# Patient Record
Sex: Male | Born: 1965 | Race: White | Hispanic: No | Marital: Married | Smoking: Never smoker
Health system: Southern US, Community
[De-identification: ages and names within clinical notes are randomized; demographics above are authoritative.]

## PROBLEM LIST (undated history)

## (undated) DIAGNOSIS — I509 Heart failure, unspecified: Secondary | ICD-10-CM

## (undated) DIAGNOSIS — I10 Essential (primary) hypertension: Secondary | ICD-10-CM

## (undated) DIAGNOSIS — I219 Acute myocardial infarction, unspecified: Secondary | ICD-10-CM

## (undated) DIAGNOSIS — I2510 Atherosclerotic heart disease of native coronary artery without angina pectoris: Secondary | ICD-10-CM

## (undated) HISTORY — DX: Atherosclerotic heart disease of native coronary artery without angina pectoris: I25.10

## (undated) HISTORY — PX: OTHER SURGICAL HISTORY: SHX169

## (undated) HISTORY — PX: CORONARY ARTERY BYPASS GRAFT: SHX141

## (undated) HISTORY — DX: Heart failure, unspecified: I50.9

---

## 2004-10-23 ENCOUNTER — Emergency Department: Payer: Self-pay | Admitting: Emergency Medicine

## 2004-10-28 ENCOUNTER — Emergency Department: Payer: Self-pay | Admitting: Emergency Medicine

## 2012-11-08 ENCOUNTER — Encounter (HOSPITAL_COMMUNITY): Payer: Self-pay | Admitting: *Deleted

## 2012-11-08 ENCOUNTER — Emergency Department (HOSPITAL_COMMUNITY)
Admission: EM | Admit: 2012-11-08 | Discharge: 2012-11-08 | Disposition: A | Discharge status: Home / Self Care | Payer: Self-pay | Attending: Emergency Medicine | Admitting: Emergency Medicine

## 2012-11-08 DIAGNOSIS — I10 Essential (primary) hypertension: Secondary | ICD-10-CM | POA: Insufficient documentation

## 2012-11-08 DIAGNOSIS — J329 Chronic sinusitis, unspecified: Secondary | ICD-10-CM

## 2012-11-08 DIAGNOSIS — R0982 Postnasal drip: Secondary | ICD-10-CM | POA: Insufficient documentation

## 2012-11-08 DIAGNOSIS — H538 Other visual disturbances: Secondary | ICD-10-CM | POA: Insufficient documentation

## 2012-11-08 DIAGNOSIS — J019 Acute sinusitis, unspecified: Secondary | ICD-10-CM | POA: Insufficient documentation

## 2012-11-08 DIAGNOSIS — R059 Cough, unspecified: Secondary | ICD-10-CM | POA: Insufficient documentation

## 2012-11-08 DIAGNOSIS — R05 Cough: Secondary | ICD-10-CM | POA: Insufficient documentation

## 2012-11-08 HISTORY — DX: Essential (primary) hypertension: I10

## 2012-11-08 MED ORDER — PREDNISONE 20 MG PO TABS: ORAL_TABLET | ORAL | Status: DC

## 2012-11-08 MED ORDER — AZITHROMYCIN 250 MG PO TABS: 250.0000 mg | ORAL_TABLET | Freq: Every day | ORAL | Status: DC

## 2012-11-08 MED ORDER — FLUTICASONE PROPIONATE 50 MCG/ACT NA SUSP: 2.0000 | Freq: Every day | NASAL | Status: DC

## 2012-11-08 NOTE — ED Notes (Signed)
Pt in c/o congestion and facial pressure, history of allergies and sinus infection in the past, states this feels the same

## 2012-11-08 NOTE — ED Provider Notes (Signed)
History    This chart was scribed for Junious Silk (PA) non-physician practitioner working with Loren Racer, MD by Sofie Rower, ED Scribe. This patient was seen in room WTR9/WTR9 and the patient's care was started at 8:33PM.   CSN: [id number]  Arrival date & time 11/08/12  1949   First MD Initiated Contact with Patient 11/08/12 2033      Chief Complaint  Patient presents with  . Recurrent Sinusitis    (Consider location/radiation/quality/duration/timing/severity/associated sxs/prior treatment) The history is provided by the patient. No language interpreter was used.    Robert Berry is a 47 y.o. male , with a hx of hypertension and sinus infection (X 2 per year at baseline), who presents to the Emergency Department complaining of gradual, progressively worsening, recurrent sinusitis, onset three weeks ago.  Associated symptoms include facial pain, sore throat, non productive cough and  blurred vision. The pt reports he has been experiencing a progressively worsening facial congestion and pressure for the past three weeks, however, he has been unable to sleep for the past three days, which has prompted his concern and desire to seek medical evaluation at [facility] this evening (11/08/12). The pt has taken Zyrtec and Flonase in the past, which he informs, does not provide relief of the recurrent sinusitis.  The pt does not smoke or drink alcohol.   Pt does not have a PCP. He is moving back to [place], [place] and will establish care there.     Past Medical History  Diagnosis Date  . Hypertension     History reviewed. No pertinent past surgical history.  History reviewed. No pertinent family history.  History  Substance Use Topics  . Smoking status: Not on file  . Smokeless tobacco: Not on file  . Alcohol Use: Not on file      Review of Systems  HENT: Positive for sinus pressure. Negative for ear pain.   Eyes: Positive for visual disturbance.  All other systems reviewed and  are negative.    Allergies  Review of patient's allergies indicates no known allergies.  Home Medications  No current outpatient prescriptions on file.  BP 138/102  Pulse 102  Temp(Src) 98.3 F (36.8 C) (Oral)  Resp 20  Ht 5\' 8"  (1.727 m)  Wt 172 lb (78.019 kg)  BMI 26.16 kg/m2  SpO2 100%  Physical Exam  Nursing note and vitals reviewed. Constitutional: He is oriented to person, place, and time. He appears well-developed and well-nourished. No distress.  HENT:  Head: Normocephalic and atraumatic.  Right Ear: Tympanic membrane and external ear normal.  Left Ear: Tympanic membrane and external ear normal.  Nose: Right sinus exhibits maxillary sinus tenderness and frontal sinus tenderness. Left sinus exhibits maxillary sinus tenderness and frontal sinus tenderness.  Mouth/Throat: No tonsillar abscesses.  Frontal sinus tenderness, maxillary sinus tenderness and visible post nasal drip at oropharynx.  Eyes: Conjunctivae are normal.  Neck: Normal range of motion. No tracheal deviation present.  Cardiovascular: Normal rate, regular rhythm and normal heart sounds.   Pulmonary/Chest: Effort normal and breath sounds normal. No stridor.  Abdominal: Soft. He exhibits no distension. There is no tenderness.  Musculoskeletal: Normal range of motion.  Neurological: He is alert and oriented to person, place, and time.  Skin: Skin is warm and dry. He is not diaphoretic.  Psychiatric: He has a normal mood and affect. His behavior is normal.    ED Course  Procedures (including critical care time)  DIAGNOSTIC STUDIES: Oxygen Saturation is 100% on room air,  normal by my interpretation.    COORDINATION OF CARE:  8:58 PM- Treatment plan concerning the application of antibiotics, acquisition of a PCP in [place], [place]., and referral to ENT discussed with patient. Pt agrees with treatment.      Labs Reviewed - No data to display No results found.   1. Sinusitis       MDM   Patient complaining of symptoms of sinusitis.    Severe symptoms have been present for greater than 10 days with purulent nasal discharge and maxillary sinus pain.  Concern for acute bacterial rhinosinusitis.  Patient discharged with Azithromycin.  Instructions given for warm saline nasal wash and recommendations for follow-up with primary care physician and possible ENT due to recurrence of sinus infections.  Flonase given.         I personally performed the services described in this documentation, which was scribed in my presence. The recorded information has been reviewed and is accurate.     Mora Bellman, PA-C 11/09/12 1348

## 2012-11-08 NOTE — ED Notes (Signed)
Pt ambulatory to exam room with steady gait. Pt states he has had headache and pain and pressure in face. States he has had nasal congestion with yellow discharge. Pt with no acute distress.

## 2012-11-09 NOTE — ED Provider Notes (Signed)
Medical screening examination/treatment/procedure(s) were performed by non-physician practitioner and as supervising physician I was immediately available for consultation/collaboration.   Loren Racer, MD 11/09/12 [phone]

## 2016-02-19 ENCOUNTER — Emergency Department: Payer: Self-pay

## 2016-02-19 ENCOUNTER — Encounter: Payer: Self-pay | Admitting: Intensive Care

## 2016-02-19 ENCOUNTER — Emergency Department
Admission: EM | Admit: 2016-02-19 | Discharge: 2016-02-19 | Disposition: A | Discharge status: Home / Self Care | Payer: Self-pay | Attending: Emergency Medicine | Admitting: Emergency Medicine

## 2016-02-19 DIAGNOSIS — Z7951 Long term (current) use of inhaled steroids: Secondary | ICD-10-CM | POA: Insufficient documentation

## 2016-02-19 DIAGNOSIS — Z7952 Long term (current) use of systemic steroids: Secondary | ICD-10-CM | POA: Insufficient documentation

## 2016-02-19 DIAGNOSIS — Z792 Long term (current) use of antibiotics: Secondary | ICD-10-CM | POA: Insufficient documentation

## 2016-02-19 DIAGNOSIS — K5732 Diverticulitis of large intestine without perforation or abscess without bleeding: Secondary | ICD-10-CM | POA: Insufficient documentation

## 2016-02-19 DIAGNOSIS — I10 Essential (primary) hypertension: Secondary | ICD-10-CM | POA: Insufficient documentation

## 2016-02-19 LAB — CBC
HCT: 46.1 % (ref 40.0–52.0)
Hemoglobin: 15.8 g/dL (ref 13.0–18.0)
MCH: 29.7 pg (ref 26.0–34.0)
MCHC: 34.3 g/dL (ref 32.0–36.0)
MCV: 86.8 fL (ref 80.0–100.0)
PLATELETS: 190 10*3/uL (ref 150–440)
RBC: 5.31 MIL/uL (ref 4.40–5.90)
RDW: 13.6 % (ref 11.5–14.5)
WBC: 14.9 10*3/uL — AB (ref 3.8–10.6)

## 2016-02-19 LAB — COMPREHENSIVE METABOLIC PANEL
ALT: 21 U/L (ref 17–63)
AST: 26 U/L (ref 15–41)
Albumin: 4.4 g/dL (ref 3.5–5.0)
Alkaline Phosphatase: 80 U/L (ref 38–126)
Anion gap: 8 (ref 5–15)
BILIRUBIN TOTAL: 0.6 mg/dL (ref 0.3–1.2)
BUN: 10 mg/dL (ref 6–20)
CALCIUM: 8.9 mg/dL (ref 8.9–10.3)
CO2: 25 mmol/L (ref 22–32)
CREATININE: 0.99 mg/dL (ref 0.61–1.24)
Chloride: 102 mmol/L (ref 101–111)
GFR calc Af Amer: >60 mL/min (ref >60)
Glucose, Bld: 119 mg/dL — ABNORMAL HIGH (ref 65–99)
Potassium: 3.9 mmol/L (ref 3.5–5.1)
Sodium: 135 mmol/L (ref 135–145)
TOTAL PROTEIN: 8.1 g/dL (ref 6.5–8.1)

## 2016-02-19 LAB — LIPASE, BLOOD: Lipase: 23 U/L (ref 11–51)

## 2016-02-19 MED ORDER — CIPROFLOXACIN HCL 500 MG PO TABS: 500.0000 mg | ORAL_TABLET | Freq: Two times a day (BID) | ORAL | 0 refills | Status: AC

## 2016-02-19 MED ORDER — OXYCODONE-ACETAMINOPHEN 5-325 MG PO TABS
2.0000 | ORAL_TABLET | Freq: Once | ORAL | Status: AC
  Administered 2016-02-19: 2 via ORAL
  Filled 2016-02-19: qty 2

## 2016-02-19 MED ORDER — METRONIDAZOLE 500 MG PO TABS
500.0000 mg | ORAL_TABLET | Freq: Once | ORAL | Status: AC
  Administered 2016-02-19: 500 mg via ORAL
  Filled 2016-02-19: qty 1

## 2016-02-19 MED ORDER — ONDANSETRON 4 MG PO TBDP: 4.0000 mg | ORAL_TABLET | Freq: Once | ORAL | Status: DC | PRN

## 2016-02-19 MED ORDER — OXYCODONE-ACETAMINOPHEN 5-325 MG PO TABS: 1.0000 | ORAL_TABLET | Freq: Four times a day (QID) | ORAL | 0 refills | Status: DC | PRN

## 2016-02-19 MED ORDER — CIPROFLOXACIN HCL 500 MG PO TABS
500.0000 mg | ORAL_TABLET | Freq: Once | ORAL | Status: AC
  Administered 2016-02-19: 500 mg via ORAL
  Filled 2016-02-19: qty 1

## 2016-02-19 MED ORDER — IOPAMIDOL (ISOVUE-300) INJECTION 61%
30.0000 mL | Freq: Once | INTRAVENOUS | Status: AC | PRN
  Administered 2016-02-19: 30 mL via ORAL

## 2016-02-19 MED ORDER — MORPHINE SULFATE (PF) 4 MG/ML IV SOLN
4.0000 mg | Freq: Once | INTRAVENOUS | Status: AC
  Administered 2016-02-19: 4 mg via INTRAVENOUS
  Filled 2016-02-19: qty 1

## 2016-02-19 MED ORDER — METRONIDAZOLE 500 MG PO TABS: 500.0000 mg | ORAL_TABLET | Freq: Three times a day (TID) | ORAL | 0 refills | Status: AC

## 2016-02-19 MED ORDER — SODIUM CHLORIDE 0.9 % IV BOLUS (SEPSIS)
1000.0000 mL | Freq: Once | INTRAVENOUS | Status: AC
  Administered 2016-02-19: 1000 mL via INTRAVENOUS

## 2016-02-19 MED ORDER — IOPAMIDOL (ISOVUE-300) INJECTION 61%
100.0000 mL | Freq: Once | INTRAVENOUS | Status: AC | PRN
  Administered 2016-02-19: 100 mL via INTRAVENOUS

## 2016-02-19 MED ORDER — ONDANSETRON HCL 4 MG/2ML IJ SOLN
4.0000 mg | Freq: Once | INTRAMUSCULAR | Status: AC
  Administered 2016-02-19: 4 mg via INTRAVENOUS
  Filled 2016-02-19: qty 2

## 2016-02-19 NOTE — ED Notes (Signed)
Pt to ED today with abdominal pain and multiple abdominal issues spanning over several months.  Pt states he has found blood in his stools on multiple occasions in the last 3 months, has lost approx 30lb in the last 3 months.  Pt states pain became worse last night and is the worst when he is trying to have a BM.

## 2016-02-19 NOTE — ED Provider Notes (Signed)
[facility] Emergency Department Provider Note  Time seen: 9:04 AM  I have reviewed the triage vital signs and the nursing notes.   HISTORY  Chief Complaint Abdominal Pain    HPI Robert Berry is a 50 y.o. male with a past medical history of hypertension who presents the emergency department for lower abdominal pain. According to the patient for the past 3 days he has been experiencing lower abdominal discomfort. Patient states constipation, denies diarrhea. Denies fever. States he has had lower abdominal discomfort coming and going for the past 6 months, but much worse over the past 3 days. Also states an unintentional 30 pound weight loss over the past 3 months. Describes his abdominal pain currently as moderate and dull aching/cramping type pain located mostly in the lower abdomen and right lower quadrant.  Past Medical History:  Diagnosis Date  . Hypertension     There are no active problems to display for this patient.   Past Surgical History:  Procedure Laterality Date  . facial construction L side Left     Prior to Admission medications   Medication Sig Start Date End Date Taking? Authorizing Provider  azithromycin (ZITHROMAX) 250 MG tablet Take 1 tablet (250 mg total) by mouth daily. Take first 2 tablets together, then 1 every day until finished. 11/08/12   Junious Silk, PA-C  cetirizine-pseudoephedrine (ZYRTEC-D) 5-120 MG per tablet Take 1 tablet by mouth 2 (two) times daily.    Historical Provider, MD  fluticasone (FLONASE) 50 MCG/ACT nasal spray Place 2 sprays into the nose daily. 11/08/12   Junious Silk, PA-C  predniSONE (DELTASONE) 20 MG tablet 3 tabs po day one, then 2 po daily x 4 days 11/08/12   Junious Silk, PA-C    No Known Allergies  History reviewed. No pertinent family history.  Social History Social History  Substance Use Topics  . Smoking status: Never Smoker  . Smokeless tobacco: Never Used  . Alcohol use No     Review of Systems Constitutional: Negative for fever. Cardiovascular: Negative for chest pain. Respiratory: Negative for shortness of breath. Gastrointestinal: Positive for lower abdominal pain. Positive nausea. Negative for vomiting. Negative for diarrhea. Positive constipation. Genitourinary: Negative for dysuria Neurological: Negative for headache 10-point ROS otherwise negative.  ____________________________________________   PHYSICAL EXAM:  VITAL SIGNS: ED Triage Vitals  Enc Vitals Group     BP 02/19/16 0830 (!) 158/111     Pulse Rate 02/19/16 0830 94     Resp 02/19/16 0830 20     Temp 02/19/16 0830 98.1 F (36.7 C)     Temp Source 02/19/16 0830 Oral     SpO2 02/19/16 0830 98 %     Weight 02/19/16 0831 153 lb (69.4 kg)     Height 02/19/16 0831 5\' 8"  (1.727 m)     Head Circumference --      Peak Flow --      Pain Score 02/19/16 0831 8     Pain Loc --      Pain Edu? --      Excl. in GC? --     Constitutional: Alert and oriented. Well appearing and in no distress. Eyes: Normal exam ENT   Head: Normocephalic and atraumatic.   Mouth/Throat: Mucous membranes are moist. Cardiovascular: Normal rate, regular rhythm. No murmur Respiratory: Normal respiratory effort without tachypnea nor retractions. Breath sounds are clear  Gastrointestinal: Soft, moderate lower abdominal pain right and left lower quadrant. No rebound or guarding. No distention. Musculoskeletal: Nontender  with normal range of motion in all extremities.  Neurologic:  Normal speech and language. No gross focal neurologic deficits  Skin:  Skin is warm, dry and intact.  Psychiatric: Mood and affect are normal.  ____________________________________________     RADIOLOGY  CT consistent with sigmoid diverticulitis.  ____________________________________________   INITIAL IMPRESSION / ASSESSMENT AND PLAN / ED COURSE  Pertinent labs & imaging results that were available during my care of the  patient were reviewed by me and considered in my medical decision making (see chart for details).  The patient presents the emergency department 3 days of lower abdominal pain, cramping in nature with nausea. Patient states he's had lower abdominal pain, going for the past 6 months also states an unintentional 30 pound weight loss over the past 3 months. Patient states 2 months ago he noticed some blood in his stool but has not noted any since. Has not had a colonoscopy. Patient describes as lower abdominal pain as moderate. We will dose pain and nausea medication, check labs and obtain a CT scan of the abdomen/pelvis to further evaluate. Patient agreeable to plan.  CT consistent with an incompetent it sigmoid diverticulitis. We'll place on antibiotics, pain medication and have him follow-up with his primary care doctor. I discussed return precautions with the patient.  ____________________________________________   FINAL CLINICAL IMPRESSION(S) / ED DIAGNOSES  Lower abdominal pain Sigmoid diverticulitis    Minna AntisKevin Shailee Foots, MD 02/19/16 1054

## 2016-02-19 NOTE — ED Triage Notes (Addendum)
Pt presents to ER with RLQ and LLQ pain with Nausea since yesterday at 0900. Patient reports having blood in his stool twice in the last 6 months. Pt denies emesis. Pt reports last BM 02/17/16. Pt able to ambulate to triage with nad noted. Patient reports losing around 30lb in the last 3 months unexpectedly.

## 2017-06-25 ENCOUNTER — Emergency Department
Admission: EM | Admit: 2017-06-25 | Discharge: 2017-06-25 | Disposition: A | Discharge status: Home / Self Care | Payer: Self-pay | Attending: Emergency Medicine | Admitting: Emergency Medicine

## 2017-06-25 ENCOUNTER — Other Ambulatory Visit: Payer: Self-pay

## 2017-06-25 ENCOUNTER — Encounter: Payer: Self-pay | Admitting: Emergency Medicine

## 2017-06-25 DIAGNOSIS — J0100 Acute maxillary sinusitis, unspecified: Secondary | ICD-10-CM | POA: Insufficient documentation

## 2017-06-25 DIAGNOSIS — J029 Acute pharyngitis, unspecified: Secondary | ICD-10-CM | POA: Insufficient documentation

## 2017-06-25 DIAGNOSIS — Z79899 Other long term (current) drug therapy: Secondary | ICD-10-CM | POA: Insufficient documentation

## 2017-06-25 DIAGNOSIS — M791 Myalgia, unspecified site: Secondary | ICD-10-CM | POA: Insufficient documentation

## 2017-06-25 DIAGNOSIS — I10 Essential (primary) hypertension: Secondary | ICD-10-CM | POA: Insufficient documentation

## 2017-06-25 MED ORDER — AMOXICILLIN-POT CLAVULANATE 875-125 MG PO TABS: 1.0000 | ORAL_TABLET | Freq: Two times a day (BID) | ORAL | 0 refills | Status: DC

## 2017-06-25 NOTE — ED Triage Notes (Signed)
Pt to ED c/o Nasal congestion, sore throat, and body aches x 1 week. Pt states that his wife had antibiotics left over at home and he took 6 amoxicillin, pt states that it started helping but he is out of medicine now. Pt in NAD at this time.

## 2017-06-25 NOTE — ED Notes (Signed)
See triage note.states he developed some sinus pressure and drainage several days ago  Body aches   Subjective fever afebrile on arrival

## 2017-06-25 NOTE — ED Provider Notes (Signed)
[facility] Emergency Department Provider Note  ____________________________________________  Time seen: Approximately 7:56 AM  I have reviewed the triage vital signs and the nursing notes.   HISTORY  Chief Complaint Nasal Congestion   HPI Robert Berry is a 52 y.o. male who presents to the emergency department for evaluation and treatment of nasal congestion, sore throat, body aches for the past week.  He started taking some amoxicillin that was left over, but he only had 6 doses.  He states that while taking the antibiotic he felt as if he were improving.  Negative for  Past Medical History:  Diagnosis Date  . Hypertension     There are no active problems to display for this patient.   Past Surgical History:  Procedure Laterality Date  . facial construction L side Left     Prior to Admission medications   Medication Sig Start Date End Date Taking? Authorizing Provider  amoxicillin-clavulanate (AUGMENTIN) 875-125 MG tablet Take 1 tablet by mouth 2 (two) times daily. 06/25/17   Aveer Bartow, Rulon Eisenmenger B, FNP  azithromycin (ZITHROMAX) 250 MG tablet Take 1 tablet (250 mg total) by mouth daily. Take first 2 tablets together, then 1 every day until finished. 11/08/12   Junious Silk, PA-C  cetirizine-pseudoephedrine (ZYRTEC-D) 5-120 MG per tablet Take 1 tablet by mouth 2 (two) times daily.    [provider]  fluticasone (FLONASE) 50 MCG/ACT nasal spray Place 2 sprays into the nose daily. 11/08/12   Junious Silk, PA-C  oxyCODONE-acetaminophen (ROXICET) 5-325 MG tablet Take 1 tablet by mouth every 6 (six) hours as needed. 02/19/16   Minna Antis, MD  predniSONE (DELTASONE) 20 MG tablet 3 tabs po day one, then 2 po daily x 4 days 11/08/12   Junious Silk, PA-C    Allergies Patient has no known allergies.  No family history on file.  Social History Social History   Tobacco Use  . Smoking status: Never Smoker  . Smokeless tobacco: Never Used   Substance Use Topics  . Alcohol use: No  . Drug use: No    Review of Systems Constitutional: Negative fever/chills ENT: Positive for sore throat. Cardiovascular: Denies chest pain. Respiratory: Negative for shortness of breath.   positive for cough. Gastrointestinal: Negative for nausea, no vomiting.  No diarrhea.  Musculoskeletal: Positive for body aches Skin: Negative for rash. Neurological: Negative for headaches ____________________________________________   PHYSICAL EXAM:  VITAL SIGNS: ED Triage Vitals  Enc Vitals Group     BP 06/25/17 0753 (!) 148/97     Pulse Rate 06/25/17 0753 71     Resp 06/25/17 0753 16     Temp 06/25/17 0753 98.2 F (36.8 C)     Temp Source 06/25/17 0753 Oral     SpO2 06/25/17 0753 99 %     Weight 06/25/17 0753 153 lb (69.4 kg)     Height 06/25/17 0753 5\' 8"  (1.727 m)     Head Circumference --      Peak Flow --      Pain Score 06/25/17 0752 8     Pain Loc --      Pain Edu? --      Excl. in GC? --     Constitutional: Alert and oriented.  Acutely ill appearing and in no acute distress. Eyes: Conjunctivae are normal. EOMI. Ears: Bilateral tympanic membranes appear injected Nose: Sinus congestion noted, tenderness over the left maxillary sinus; clear rhinnorhea. Mouth/Throat: Mucous membranes are moist.  Oropharynx erythematous. Tonsils without exudate.  Uvula is midline.. Neck: No stridor.  Lymphatic: No anterior cervical lymphadenopathy. Cardiovascular: Normal rate, regular rhythm. Good peripheral circulation. Respiratory: Normal respiratory effort.  No retractions.  Breath sounds clear to auscultation. Gastrointestinal: Soft and nontender.  Musculoskeletal: FROM x 4 extremities.  Neurologic:  Normal speech and language.  Skin:  Skin is warm, dry and intact. No rash noted. Psychiatric: Mood and affect are normal. Speech and behavior are normal.  ____________________________________________   LABS (all labs ordered are listed, but  only abnormal results are displayed)  Labs Reviewed - No data to display ____________________________________________  EKG  Not indicated ____________________________________________  RADIOLOGY  Not indicated ____________________________________________   PROCEDURES  Procedure(s) performed: None  Critical Care performed: No ____________________________________________   INITIAL IMPRESSION / ASSESSMENT AND PLAN / ED COURSE  52 year old male presenting to the emergency department for evaluation treatment of symptoms most consistent with maxillary sinusitis.  He will be given a prescription for Augmentin and encouraged to follow-up with the primary care provider of his choice for symptoms that are not improving over the he was instructed to return to the emergency department for symptoms of change or worsen if he is unable to schedule an appointment.  Medications - No data to display  ED Discharge Orders        Ordered    amoxicillin-clavulanate (AUGMENTIN) 875-125 MG tablet  2 times daily     06/25/17 0818      Pertinent labs & imaging results that were available during my care of the patient were reviewed by me and considered in my medical decision making (see chart for details).    If controlled substance prescribed during this visit, 12 month history viewed on the [facility] prior to issuing an initial prescription for Schedule II or III opiod. ____________________________________________   FINAL CLINICAL IMPRESSION(S) / ED DIAGNOSES  Final diagnoses:  Acute non-recurrent maxillary sinusitis    Note:  This document was prepared using Dragon voice recognition software and may include unintentional dictation errors.     Chinita Pesterriplett, Atisha Hamidi B, FNP 06/25/17 1546    Minna AntisPaduchowski, Kevin, MD 06/30/17 1947

## 2018-04-08 ENCOUNTER — Emergency Department: Payer: Self-pay

## 2018-04-08 ENCOUNTER — Other Ambulatory Visit: Payer: Self-pay

## 2018-04-08 ENCOUNTER — Emergency Department
Admission: EM | Admit: 2018-04-08 | Discharge: 2018-04-08 | Disposition: A | Discharge status: Home / Self Care | Payer: Self-pay | Attending: Emergency Medicine | Admitting: Emergency Medicine

## 2018-04-08 DIAGNOSIS — Z79899 Other long term (current) drug therapy: Secondary | ICD-10-CM | POA: Insufficient documentation

## 2018-04-08 DIAGNOSIS — S20211A Contusion of right front wall of thorax, initial encounter: Secondary | ICD-10-CM

## 2018-04-08 DIAGNOSIS — W0110XA Fall on same level from slipping, tripping and stumbling with subsequent striking against unspecified object, initial encounter: Secondary | ICD-10-CM | POA: Insufficient documentation

## 2018-04-08 DIAGNOSIS — I10 Essential (primary) hypertension: Secondary | ICD-10-CM | POA: Insufficient documentation

## 2018-04-08 DIAGNOSIS — S5001XA Contusion of right elbow, initial encounter: Secondary | ICD-10-CM

## 2018-04-08 DIAGNOSIS — Y929 Unspecified place or not applicable: Secondary | ICD-10-CM | POA: Insufficient documentation

## 2018-04-08 DIAGNOSIS — Y9389 Activity, other specified: Secondary | ICD-10-CM | POA: Insufficient documentation

## 2018-04-08 DIAGNOSIS — Y998 Other external cause status: Secondary | ICD-10-CM | POA: Insufficient documentation

## 2018-04-08 MED ORDER — OXYCODONE-ACETAMINOPHEN 7.5-325 MG PO TABS: 1.0000 | ORAL_TABLET | Freq: Four times a day (QID) | ORAL | 0 refills | Status: DC | PRN

## 2018-04-08 MED ORDER — IBUPROFEN 600 MG PO TABS: 600.0000 mg | ORAL_TABLET | Freq: Three times a day (TID) | ORAL | 0 refills | Status: DC | PRN

## 2018-04-08 MED ORDER — HYDROMORPHONE HCL 1 MG/ML IJ SOLN
1.0000 mg | Freq: Once | INTRAMUSCULAR | Status: AC
  Administered 2018-04-08: 1 mg via INTRAMUSCULAR
  Filled 2018-04-08: qty 1

## 2018-04-08 MED ORDER — KETOROLAC TROMETHAMINE 60 MG/2ML IM SOLN
60.0000 mg | Freq: Once | INTRAMUSCULAR | Status: AC
  Administered 2018-04-08: 60 mg via INTRAMUSCULAR
  Filled 2018-04-08: qty 2

## 2018-04-08 NOTE — ED Notes (Signed)
See triage note  States his floor was wet in his hallway  He slipped in water and landed on his back  Having pain mainly to right mid back and post rib area   Also has a bruise noted to right elbow area

## 2018-04-08 NOTE — ED Provider Notes (Signed)
[facility] Emergency Department Provider Note   ____________________________________________   First MD Initiated Contact with Patient 04/08/18 0920     (approximate)  I have reviewed the triage vital signs and the nursing notes.   HISTORY  Chief Complaint Fall and Back Pain    HPI Robert Berry is a 52 y.o. male patient presents with right lateral rib and elbow pain secondary to a fall.  Patient states slipped on a wet floor yesterday.  Patient states rib pain has increased and worse with deep breathing.  Patient also state pain to the right elbow increased with extension.  Patient rates his pain as 9/10.  Patient described the pain is "sharp/achy".  No palliative measures for complaint.  Patient is right-hand dominant.  Past Medical History:  Diagnosis Date  . Hypertension     There are no active problems to display for this patient.   Past Surgical History:  Procedure Laterality Date  . facial construction L side Left     Prior to Admission medications   Medication Sig Start Date End Date Taking? Authorizing Provider  amoxicillin-clavulanate (AUGMENTIN) 875-125 MG tablet Take 1 tablet by mouth 2 (two) times daily. 06/25/17   Triplett, Rulon Eisenmenger B, FNP  azithromycin (ZITHROMAX) 250 MG tablet Take 1 tablet (250 mg total) by mouth daily. Take first 2 tablets together, then 1 every day until finished. 11/08/12   Junious Silk, PA-C  cetirizine-pseudoephedrine (ZYRTEC-D) 5-120 MG per tablet Take 1 tablet by mouth 2 (two) times daily.    [provider]  fluticasone (FLONASE) 50 MCG/ACT nasal spray Place 2 sprays into the nose daily. 11/08/12   Junious Silk, PA-C  ibuprofen (ADVIL,MOTRIN) 600 MG tablet Take 1 tablet (600 mg total) by mouth every 8 (eight) hours as needed. 04/08/18   Joni Reining, PA-C  oxyCODONE-acetaminophen (PERCOCET) 7.5-325 MG tablet Take 1 tablet by mouth every 6 (six) hours as needed for severe pain. 04/08/18   Joni Reining, PA-C  oxyCODONE-acetaminophen (ROXICET) 5-325 MG tablet Take 1 tablet by mouth every 6 (six) hours as needed. 02/19/16   Minna Antis, MD  predniSONE (DELTASONE) 20 MG tablet 3 tabs po day one, then 2 po daily x 4 days 11/08/12   Junious Silk, PA-C    Allergies Patient has no known allergies.  No family history on file.  Social History Social History   Tobacco Use  . Smoking status: Never Smoker  . Smokeless tobacco: Never Used  Substance Use Topics  . Alcohol use: No  . Drug use: No    Review of Systems Constitutional: No fever/chills Eyes: No visual changes. ENT: No sore throat. Cardiovascular: Denies chest pain. Respiratory: Denies shortness of breath. Gastrointestinal: No abdominal pain.  No nausea, no vomiting.  No diarrhea.  No constipation. Genitourinary: Negative for dysuria. Musculoskeletal: Right elbow right lateral chest wall pain. Skin: Negative for rash. Neurological: Negative for headaches, focal weakness or numbness. Endocrine:Hypertension. ____________________________________________   PHYSICAL EXAM:  VITAL SIGNS: ED Triage Vitals  Enc Vitals Group     BP 04/08/18 0904 (!) 158/102     Pulse Rate 04/08/18 0904 83     Resp 04/08/18 0904 16     Temp 04/08/18 0904 98.2 F (36.8 C)     Temp Source 04/08/18 0904 Oral     SpO2 04/08/18 0904 99 %     Weight 04/08/18 0905 158 lb (71.7 kg)     Height 04/08/18 0905 5\' 8"  (1.727 m)  Head Circumference --      Peak Flow --      Pain Score 04/08/18 0904 9     Pain Loc --      Pain Edu? --      Excl. in GC? --   Constitutional: Alert and oriented.  Moderate distress.  . Neck:No cervical spine tenderness to palpation. Hematological/Lymphatic/Immunilogical: No cervical lymphadenopathy. Cardiovascular: Normal rate, regular rhythm. Grossly normal heart sounds.  Good peripheral circulation.  Elevated blood pressure. Respiratory: Normal respiratory effort.  No retractions.  Continue with  inspirations.  Lungs CTAB. Gastrointestinal: Soft and nontender. No distention. No abdominal bruits. No CVA tenderness. Musculoskeletal: Obvious deformity to the right elbow or chest wall.. Neurologic:  Normal speech and language. No gross focal neurologic deficits are appreciated. No gait instability. Skin:  Skin is warm, dry and intact. No rash noted. Psychiatric: Mood and affect are normal. Speech and behavior are normal.  ____________________________________________   LABS (all labs ordered are listed, but only abnormal results are displayed)  Labs Reviewed - No data to display ____________________________________________  EKG   ____________________________________________  RADIOLOGY  ED MD interpretation:    Official radiology report(s): Dg Ribs Unilateral W/chest Right  Result Date: 04/08/2018 CLINICAL DATA:  Right lower posterior rib pain. EXAM: RIGHT RIBS AND CHEST - 3+ VIEW COMPARISON:  None. FINDINGS: No fracture or other bone lesions are seen involving the ribs. There is no evidence of pneumothorax or pleural effusion. Both lungs are clear. Heart size and mediastinal contours are within normal limits. IMPRESSION: Negative. Electronically Signed   By: Charlett Nose M.D.   On: 04/08/2018 09:57   Dg Elbow Complete Right  Result Date: 04/08/2018 CLINICAL DATA:  Fall.  Right elbow pain EXAM: RIGHT ELBOW - COMPLETE 3+ VIEW COMPARISON:  None. FINDINGS: There is no evidence of fracture, dislocation, or joint effusion. There is no evidence of arthropathy or other focal bone abnormality. Soft tissues are unremarkable. IMPRESSION: Negative. Electronically Signed   By: Charlett Nose M.D.   On: 04/08/2018 09:58    ____________________________________________   PROCEDURES  Procedure(s) performed: None  Procedures  Critical Care performed: No  ____________________________________________   INITIAL IMPRESSION / ASSESSMENT AND PLAN / ED COURSE  As part of my medical decision  making, I reviewed the following data within the electronic MEDICAL RECORD NUMBER    Right elbow and right rib pain secondary to fall.  Discussed negative x-ray findings with patient.  Patient given discharge care instruction advised take medication as directed.  [facility] advised [facility] condition [facility].      ____________________________________________   FINAL CLINICAL IMPRESSION(S) / ED DIAGNOSES  Final diagnoses:  Rib contusion, right, initial encounter  Contusion of right elbow, initial encounter     ED Discharge Orders         Ordered    oxyCODONE-acetaminophen (PERCOCET) 7.5-325 MG tablet  Every 6 hours PRN     04/08/18 1013    ibuprofen (ADVIL,MOTRIN) 600 MG tablet  Every 8 hours PRN     04/08/18 1013           Note:  This document was prepared using Dragon voice recognition software and may include unintentional dictation errors.    Joni Reining, PA-C 04/08/18 1033    Schaevitz, Myra Rude, MD 04/08/18 1100

## 2018-04-08 NOTE — ED Triage Notes (Signed)
Pt slipped in water and fell onto back yesterday AM. C/o mid right sided back pain and right elbow pain. Pt alert and oriented X4, active, cooperative, pt in NAD. RR even and unlabored, color WNL.

## 2019-09-07 ENCOUNTER — Other Ambulatory Visit: Payer: Self-pay

## 2019-09-07 ENCOUNTER — Emergency Department
Admission: EM | Admit: 2019-09-07 | Discharge: 2019-09-07 | Disposition: A | Discharge status: Home / Self Care | Payer: Self-pay | Attending: Emergency Medicine | Admitting: Emergency Medicine

## 2019-09-07 ENCOUNTER — Emergency Department: Payer: Self-pay

## 2019-09-07 ENCOUNTER — Encounter: Payer: Self-pay | Admitting: Emergency Medicine

## 2019-09-07 DIAGNOSIS — X500XXA Overexertion from strenuous movement or load, initial encounter: Secondary | ICD-10-CM | POA: Insufficient documentation

## 2019-09-07 DIAGNOSIS — Y999 Unspecified external cause status: Secondary | ICD-10-CM | POA: Insufficient documentation

## 2019-09-07 DIAGNOSIS — Y929 Unspecified place or not applicable: Secondary | ICD-10-CM | POA: Insufficient documentation

## 2019-09-07 DIAGNOSIS — Z79899 Other long term (current) drug therapy: Secondary | ICD-10-CM | POA: Insufficient documentation

## 2019-09-07 DIAGNOSIS — I10 Essential (primary) hypertension: Secondary | ICD-10-CM | POA: Insufficient documentation

## 2019-09-07 DIAGNOSIS — S46012A Strain of muscle(s) and tendon(s) of the rotator cuff of left shoulder, initial encounter: Secondary | ICD-10-CM | POA: Insufficient documentation

## 2019-09-07 DIAGNOSIS — Y939 Activity, unspecified: Secondary | ICD-10-CM | POA: Insufficient documentation

## 2019-09-07 MED ORDER — OXYCODONE-ACETAMINOPHEN 5-325 MG PO TABS: 1.0000 | ORAL_TABLET | ORAL | 0 refills | Status: AC | PRN

## 2019-09-07 MED ORDER — MORPHINE SULFATE (PF) 4 MG/ML IV SOLN
4.0000 mg | Freq: Once | INTRAVENOUS | Status: AC
  Administered 2019-09-07: 4 mg via INTRAVENOUS
  Filled 2019-09-07: qty 1

## 2019-09-07 MED ORDER — MORPHINE SULFATE (PF) 2 MG/ML IV SOLN
2.0000 mg | Freq: Once | INTRAVENOUS | Status: AC
  Administered 2019-09-07: 2 mg via INTRAVENOUS
  Filled 2019-09-07: qty 1

## 2019-09-07 MED ORDER — OXYCODONE-ACETAMINOPHEN 5-325 MG PO TABS
1.0000 | ORAL_TABLET | Freq: Once | ORAL | Status: AC
  Administered 2019-09-07: 1 via ORAL
  Filled 2019-09-07: qty 1

## 2019-09-07 NOTE — ED Notes (Signed)
Pt transported to MRI 

## 2019-09-07 NOTE — ED Triage Notes (Signed)
Patient ambulatory to triage with steady gait, without difficulty or distress noted, mask in place; pt reports 3 days ago, lifting scaffolding and felt "pop" to left upper arm; large amount bruising/swelling noted

## 2019-09-07 NOTE — ED Provider Notes (Signed)
[facility] Emergency Department Provider Note  ____________________________________________   First MD Initiated Contact with Patient 09/07/19 [phone]     (approximate)  I have reviewed the triage vital signs and the nursing notes.   HISTORY  Chief Complaint Arm Pain    HPI Robert Berry is a 54 y.o. male presents emergency department secondary to 9 out of 10 left proximal arm pain which patient states began 3 days ago while he was lifting scaffolding.  Patient states that he felt a pop on his anterior left arm and has had resultant pain swelling and bruising since then.  Pain is worse with any flexion of the forearm.        Past Medical History:  Diagnosis Date  . Hypertension     There are no problems to display for this patient.   Past Surgical History:  Procedure Laterality Date  . facial construction L side Left     Prior to Admission medications   Medication Sig Start Date End Date Taking? Authorizing Provider  amoxicillin-clavulanate (AUGMENTIN) 875-125 MG tablet Take 1 tablet by mouth 2 (two) times daily. 06/25/17   Triplett, Rulon Eisenmenger B, FNP  azithromycin (ZITHROMAX) 250 MG tablet Take 1 tablet (250 mg total) by mouth daily. Take first 2 tablets together, then 1 every day until finished. 11/08/12   Junious Silk, PA-C  cetirizine-pseudoephedrine (ZYRTEC-D) 5-120 MG per tablet Take 1 tablet by mouth 2 (two) times daily.    [provider]  fluticasone (FLONASE) 50 MCG/ACT nasal spray Place 2 sprays into the nose daily. 11/08/12   Junious Silk, PA-C  ibuprofen (ADVIL,MOTRIN) 600 MG tablet Take 1 tablet (600 mg total) by mouth every 8 (eight) hours as needed. 04/08/18   Joni Reining, PA-C  oxyCODONE-acetaminophen (PERCOCET) 5-325 MG tablet Take 1 tablet by mouth every 4 (four) hours as needed. 09/07/19 09/06/20  Darci Current, MD  oxyCODONE-acetaminophen (PERCOCET) 7.5-325 MG tablet Take 1 tablet by mouth every 6 (six) hours as  needed for severe pain. 04/08/18   Joni Reining, PA-C  oxyCODONE-acetaminophen (ROXICET) 5-325 MG tablet Take 1 tablet by mouth every 6 (six) hours as needed. 02/19/16   Minna Antis, MD  predniSONE (DELTASONE) 20 MG tablet 3 tabs po day one, then 2 po daily x 4 days 11/08/12   Junious Silk, PA-C    Allergies Patient has no known allergies.  No family history on file.  Social History Social History   Tobacco Use  . Smoking status: Never Smoker  . Smokeless tobacco: Never Used  Substance Use Topics  . Alcohol use: No  . Drug use: No    Review of Systems Constitutional: No fever/chills Eyes: No visual changes. ENT: No sore throat. Cardiovascular: Denies chest pain. Respiratory: Denies shortness of breath. Gastrointestinal: No abdominal pain.  No nausea, no vomiting.  No diarrhea.  No constipation. Genitourinary: Negative for dysuria. Musculoskeletal: Positive for left arm pain swelling and bruising Integumentary: Negative for rash. Neurological: Negative for headaches, focal weakness or numbness.  ____________________________________________   PHYSICAL EXAM:  VITAL SIGNS: ED Triage Vitals [09/07/19 0407]  Enc Vitals Group     BP (!) 151/109     Pulse Rate 69     Resp 18     Temp 98.4 F (36.9 C)     Temp Source Oral     SpO2 95 %     Weight 68.5 kg (151 lb)     Height 1.727 m (5\' 8" )  Head Circumference      Peak Flow      Pain Score 9     Pain Loc      Pain Edu?      Excl. in GC?     Constitutional: Alert and oriented. Eyes: Conjunctivae are normal.  Mouth/Throat: Patient is wearing a mask. Neck: No stridor.  No meningeal signs.   Cardiovascular: Normal rate, regular rhythm. Good peripheral circulation. Grossly normal heart sounds. Respiratory: Normal respiratory effort.  No retractions. Musculoskeletal: Ecchymosis noted anterior proximal left arm extending to the antecubital fossa, tender to the area.  Range of motion limited secondary to  pain Neurologic:  Normal speech and language. No gross focal neurologic deficits are appreciated.  Skin:  Skin is warm, dry and intact. Psychiatric: Mood and affect are normal. Speech and behavior are normal.   RADIOLOGY I, [place] Adleigh Mcmasters, personally viewed and evaluated these images (plain radiographs) as part of my medical decision making, as well as reviewing the written report by the radiologist.   Official radiology report(s): MR HUMERUS LEFT WO CONTRAST  Result Date: 09/07/2019 CLINICAL DATA:  Lifting injury. Pain, swelling and bruising for 3 days. EXAM: MRI OF THE LEFT HUMERUS WITHOUT CONTRAST TECHNIQUE: Multiplanar, multisequence MR imaging of the left humerus was performed. No intravenous contrast was administered. COMPARISON:  None. FINDINGS: Unfortunately, the incorrect protocol was utilized. The patient should have had a shoulder MRI and not an MRI of the humerus. I believe there is a full-thickness retracted tear of the rotator cuff likely involving the supraspinatus and infraspinatus tendons. The subscapularis tendon appears intact. There is also a grade 2 tear of the middle portion of the deltoid muscle. I believe the long head biceps tendon is intact but exam is limited. Mild glenohumeral joint effusion is noted. There is also expected fluid in the subacromial/subdeltoid bursa. IMPRESSION: 1. Limited examination due to incorrect protocol. 2. Full-thickness retracted tear of the rotator cuff likely involving the supraspinatus and infraspinatus tendons. 3. Grade 2 tear of the middle portion of the deltoid muscle. 4. I believe the long head biceps tendon is intact. 5. Mild glenohumeral joint effusion and expected fluid in the subacromial/subdeltoid bursa. 6. Patient may require a shoulder MRI for more definitive evaluation of the above findings. Recommend orthopedic consultation. Electronically Signed   By: Rudie Meyer M.D.   On: 09/07/2019 06:58       Procedures   ____________________________________________   INITIAL IMPRESSION / MDM / ASSESSMENT AND PLAN / ED COURSE  As part of my medical decision making, I reviewed the following data within the electronic MEDICAL RECORD NUMBER  54 year old male presenting with above-stated history and physical exam presenting concern for rotator cuff/biceps tendon rupture and possible muscular hematoma.    Patient given IV morphine 2 mg with minimal improvement of pain and as such patient was given an additional 4 mg of IV morphine for pain control at present.  MRI revealed a full-thickness tear of the patient's rotator cuff with a grade 2 tear of the deltoid muscle.  Patient discussed with Dr. Odis Luster orthopedic surgeon on-call who recommended patient be placed in a sling and will follow up with the patient today in clinic.  Patient will be prescribed Percocet for home.  Advised patient not to drive or operate machinery while taking Percocet.      ____________________________________________  FINAL CLINICAL IMPRESSION(S) / ED DIAGNOSES  Final diagnoses:  Traumatic complete tear of left rotator cuff, initial encounter     MEDICATIONS GIVEN  DURING THIS VISIT:  Medications  morphine 2 MG/ML injection 2 mg (2 mg Intravenous Given 09/07/19 0422)  morphine 4 MG/ML injection 4 mg (4 mg Intravenous Given 09/07/19 0515)  oxyCODONE-acetaminophen (PERCOCET/ROXICET) 5-325 MG per tablet 1 tablet (1 tablet Oral Given 09/07/19 9381)     ED Discharge Orders         Ordered    oxyCODONE-acetaminophen (PERCOCET) 5-325 MG tablet  Every 4 hours PRN     09/07/19 0715          *Please note:  RAJEEV ESCUE was evaluated in Emergency Department on 09/08/2019 for the symptoms described in the history of present illness. He was evaluated in the context of the global COVID-19 pandemic, which necessitated consideration that the patient might be at risk for infection with the SARS-CoV-2 virus that causes  COVID-19. Institutional protocols and algorithms that pertain to the evaluation of patients at risk for COVID-19 are in a state of rapid change based on information released by regulatory bodies including the CDC and federal and state organizations. These policies and algorithms were followed during the patient's care in the ED.  Some ED evaluations and interventions may be delayed as a result of limited staffing during the pandemic.*  Note:  This document was prepared using Dragon voice recognition software and may include unintentional dictation errors.   Gregor Hams, MD 09/08/19 [phone]

## 2019-09-07 NOTE — ED Notes (Signed)
Pt back from MRI. Placed on monitor.  

## 2021-02-28 ENCOUNTER — Other Ambulatory Visit: Payer: Self-pay

## 2021-02-28 ENCOUNTER — Inpatient Hospital Stay
Admit: 2021-02-28 | Discharge: 2021-02-28 | Disposition: A | Discharge status: Home / Self Care | Payer: Self-pay | Attending: Cardiology | Admitting: Cardiology

## 2021-02-28 ENCOUNTER — Encounter: Payer: Self-pay | Admitting: Cardiology

## 2021-02-28 ENCOUNTER — Inpatient Hospital Stay
Admission: EM | Admit: 2021-02-28 | Discharge: 2021-03-04 | DRG: 246 | Disposition: A | Discharge status: Home / Self Care | Payer: Self-pay | Attending: Internal Medicine | Admitting: Internal Medicine

## 2021-02-28 ENCOUNTER — Encounter
Admission: EM | Disposition: A | Discharge status: Home / Self Care | Payer: Self-pay | Source: Home / Self Care | Attending: Internal Medicine

## 2021-02-28 ENCOUNTER — Emergency Department: Payer: Self-pay

## 2021-02-28 DIAGNOSIS — R079 Chest pain, unspecified: Secondary | ICD-10-CM

## 2021-02-28 DIAGNOSIS — I2102 ST elevation (STEMI) myocardial infarction involving left anterior descending coronary artery: Principal | ICD-10-CM | POA: Diagnosis present

## 2021-02-28 DIAGNOSIS — I25118 Atherosclerotic heart disease of native coronary artery with other forms of angina pectoris: Secondary | ICD-10-CM | POA: Diagnosis present

## 2021-02-28 DIAGNOSIS — Z8249 Family history of ischemic heart disease and other diseases of the circulatory system: Secondary | ICD-10-CM

## 2021-02-28 DIAGNOSIS — Z7982 Long term (current) use of aspirin: Secondary | ICD-10-CM

## 2021-02-28 DIAGNOSIS — E785 Hyperlipidemia, unspecified: Secondary | ICD-10-CM | POA: Diagnosis present

## 2021-02-28 DIAGNOSIS — I420 Dilated cardiomyopathy: Secondary | ICD-10-CM | POA: Diagnosis present

## 2021-02-28 DIAGNOSIS — Z20822 Contact with and (suspected) exposure to covid-19: Secondary | ICD-10-CM | POA: Diagnosis present

## 2021-02-28 DIAGNOSIS — I252 Old myocardial infarction: Secondary | ICD-10-CM

## 2021-02-28 DIAGNOSIS — Z79899 Other long term (current) drug therapy: Secondary | ICD-10-CM

## 2021-02-28 DIAGNOSIS — I110 Hypertensive heart disease with heart failure: Secondary | ICD-10-CM | POA: Diagnosis present

## 2021-02-28 DIAGNOSIS — E876 Hypokalemia: Secondary | ICD-10-CM | POA: Diagnosis present

## 2021-02-28 DIAGNOSIS — I471 Supraventricular tachycardia: Secondary | ICD-10-CM | POA: Diagnosis present

## 2021-02-28 DIAGNOSIS — I255 Ischemic cardiomyopathy: Secondary | ICD-10-CM | POA: Diagnosis present

## 2021-02-28 DIAGNOSIS — I213 ST elevation (STEMI) myocardial infarction of unspecified site: Principal | ICD-10-CM | POA: Diagnosis present

## 2021-02-28 DIAGNOSIS — I160 Hypertensive urgency: Secondary | ICD-10-CM | POA: Diagnosis present

## 2021-02-28 DIAGNOSIS — N179 Acute kidney failure, unspecified: Secondary | ICD-10-CM | POA: Diagnosis present

## 2021-02-28 HISTORY — PX: LEFT HEART CATH AND CORONARY ANGIOGRAPHY: CATH118249

## 2021-02-28 HISTORY — PX: CORONARY/GRAFT ACUTE MI REVASCULARIZATION: CATH118305

## 2021-02-28 LAB — BASIC METABOLIC PANEL
Anion gap: 9 (ref 5–15)
Anion gap: 10 (ref 5–15)
BUN: 19 mg/dL (ref 6–20)
BUN: 14 mg/dL (ref 6–20)
CO2: 24 mmol/L (ref 22–32)
CO2: 22 mmol/L (ref 22–32)
Calcium: 8.8 mg/dL — ABNORMAL LOW (ref 8.9–10.3)
Calcium: 8.4 mg/dL — ABNORMAL LOW (ref 8.9–10.3)
Chloride: 104 mmol/L (ref 98–111)
Chloride: 102 mmol/L (ref 98–111)
Creatinine, Ser: 1.24 mg/dL (ref 0.61–1.24)
Creatinine, Ser: 0.96 mg/dL (ref 0.61–1.24)
GFR, Estimated: >60 mL/min (ref >60)
GFR, Estimated: >60 mL/min (ref >60)
Glucose, Bld: 136 mg/dL — ABNORMAL HIGH (ref 70–99)
Glucose, Bld: 136 mg/dL — ABNORMAL HIGH (ref 70–99)
Potassium: 3.2 mmol/L — ABNORMAL LOW (ref 3.5–5.1)
Potassium: 3.7 mmol/L (ref 3.5–5.1)
Sodium: 137 mmol/L (ref 135–145)
Sodium: 134 mmol/L — ABNORMAL LOW (ref 135–145)

## 2021-02-28 LAB — ECHOCARDIOGRAM COMPLETE
AR max vel: 2.32 cm2
AV Area VTI: 2.5 cm2
AV Area mean vel: 2.28 cm2
AV Mean grad: 2 mmHg
AV Peak grad: 3.7 mmHg
Ao pk vel: 0.96 m/s
Area-P 1/2: 5.16 cm2
Calc EF: 40.6 %
Height: 68 in
S' Lateral: 4.1 cm
Single Plane A2C EF: 42.4 %
Single Plane A4C EF: 37.1 %
Weight: 2338.64 oz

## 2021-02-28 LAB — GLUCOSE, CAPILLARY: Glucose-Capillary: 137 mg/dL — ABNORMAL HIGH (ref 70–99)

## 2021-02-28 LAB — CBC
HCT: 45.8 % (ref 39.0–52.0)
HCT: 40.8 % (ref 39.0–52.0)
Hemoglobin: 15.5 g/dL (ref 13.0–17.0)
Hemoglobin: 14.3 g/dL (ref 13.0–17.0)
MCH: 30.2 pg (ref 26.0–34.0)
MCH: 30.6 pg (ref 26.0–34.0)
MCHC: 33.8 g/dL (ref 30.0–36.0)
MCHC: 35 g/dL (ref 30.0–36.0)
MCV: 89.1 fL (ref 80.0–100.0)
MCV: 87.2 fL (ref 80.0–100.0)
Platelets: 244 10*3/uL (ref 150–400)
Platelets: 193 10*3/uL (ref 150–400)
RBC: 5.14 MIL/uL (ref 4.22–5.81)
RBC: 4.68 MIL/uL (ref 4.22–5.81)
RDW: 13.2 % (ref 11.5–15.5)
RDW: 13.2 % (ref 11.5–15.5)
WBC: 12 10*3/uL — ABNORMAL HIGH (ref 4.0–10.5)
WBC: 13.4 10*3/uL — ABNORMAL HIGH (ref 4.0–10.5)
nRBC: 0 % (ref 0.0–0.2)
nRBC: 0 % (ref 0.0–0.2)

## 2021-02-28 LAB — MAGNESIUM: Magnesium: 2.1 mg/dL (ref 1.7–2.4)

## 2021-02-28 LAB — POCT ACTIVATED CLOTTING TIME
Activated Clotting Time: 381 seconds
Activated Clotting Time: 387 seconds
Activated Clotting Time: 190 seconds

## 2021-02-28 LAB — TROPONIN I (HIGH SENSITIVITY)
Troponin I (High Sensitivity): 182 ng/L (ref <18)
Troponin I (High Sensitivity): >24000 ng/L (ref <18)

## 2021-02-28 LAB — MRSA NEXT GEN BY PCR, NASAL: MRSA by PCR Next Gen: NOT DETECTED

## 2021-02-28 LAB — RESP PANEL BY RT-PCR (FLU A&B, COVID) ARPGX2
Influenza A by PCR: NEGATIVE
Influenza B by PCR: NEGATIVE
SARS Coronavirus 2 by RT PCR: NEGATIVE

## 2021-02-28 SURGERY — CORONARY/GRAFT ACUTE MI REVASCULARIZATION
Anesthesia: Moderate Sedation

## 2021-02-28 MED ORDER — ASPIRIN 81 MG PO CHEW
81.0000 mg | CHEWABLE_TABLET | Freq: Every day | ORAL | Status: DC
  Administered 2021-02-28 – 2021-03-04 (×5): 81 mg via ORAL
  Filled 2021-02-28 (×4): qty 1

## 2021-02-28 MED ORDER — MELPHALAN 2 MG PO TABS: 2.0000 mg | ORAL_TABLET | Freq: Every day | ORAL | Status: DC

## 2021-02-28 MED ORDER — FENTANYL CITRATE (PF) 100 MCG/2ML IJ SOLN
INTRAMUSCULAR | Status: AC
  Filled 2021-02-28: qty 2

## 2021-02-28 MED ORDER — MIDAZOLAM HCL 2 MG/2ML IJ SOLN
INTRAMUSCULAR | Status: AC
  Filled 2021-02-28: qty 2

## 2021-02-28 MED ORDER — VERAPAMIL HCL 2.5 MG/ML IV SOLN
INTRAVENOUS | Status: AC
  Filled 2021-02-28: qty 2

## 2021-02-28 MED ORDER — CHLORHEXIDINE GLUCONATE CLOTH 2 % EX PADS
6.0000 | MEDICATED_PAD | Freq: Every day | CUTANEOUS | Status: DC
  Administered 2021-02-28 – 2021-03-04 (×4): 6 via TOPICAL

## 2021-02-28 MED ORDER — TICAGRELOR 90 MG PO TABS
90.0000 mg | ORAL_TABLET | Freq: Two times a day (BID) | ORAL | Status: DC
  Administered 2021-02-28 – 2021-03-04 (×8): 90 mg via ORAL
  Filled 2021-02-28 (×10): qty 1

## 2021-02-28 MED ORDER — ATORVASTATIN CALCIUM 20 MG PO TABS
80.0000 mg | ORAL_TABLET | Freq: Every day | ORAL | Status: DC
  Administered 2021-02-28 – 2021-03-04 (×5): 80 mg via ORAL
  Filled 2021-02-28 (×5): qty 4

## 2021-02-28 MED ORDER — MIDAZOLAM HCL 2 MG/2ML IJ SOLN
INTRAMUSCULAR | Status: DC | PRN
  Administered 2021-02-28 (×2): 1 mg via INTRAVENOUS

## 2021-02-28 MED ORDER — VERAPAMIL HCL 2.5 MG/ML IV SOLN
INTRAVENOUS | Status: DC | PRN
  Administered 2021-02-28: 2.5 mg via INTRAVENOUS

## 2021-02-28 MED ORDER — POTASSIUM CHLORIDE 20 MEQ PO PACK
40.0000 meq | PACK | Freq: Once | ORAL | Status: DC
Start: 2021-02-28 — End: 2021-02-28

## 2021-02-28 MED ORDER — TICAGRELOR 90 MG PO TABS
ORAL_TABLET | ORAL | Status: DC | PRN
  Administered 2021-02-28: 180 mg via ORAL

## 2021-02-28 MED ORDER — FENTANYL CITRATE (PF) 100 MCG/2ML IJ SOLN
INTRAMUSCULAR | Status: DC | PRN
  Administered 2021-02-28 (×2): 25 ug via INTRAVENOUS

## 2021-02-28 MED ORDER — HEPARIN SODIUM (PORCINE) 1000 UNIT/ML IJ SOLN
INTRAMUSCULAR | Status: AC
  Filled 2021-02-28: qty 1

## 2021-02-28 MED ORDER — HEPARIN SODIUM (PORCINE) 5000 UNIT/ML IJ SOLN
4000.0000 [IU] | Freq: Once | INTRAMUSCULAR | Status: AC
  Administered 2021-02-28: 4000 [IU] via INTRAVENOUS

## 2021-02-28 MED ORDER — HEPARIN (PORCINE) IN NACL 2000-0.9 UNIT/L-% IV SOLN
INTRAVENOUS | Status: DC | PRN
  Administered 2021-02-28: 1000 mL

## 2021-02-28 MED ORDER — HEPARIN (PORCINE) IN NACL 1000-0.9 UT/500ML-% IV SOLN
INTRAVENOUS | Status: AC
  Filled 2021-02-28: qty 500

## 2021-02-28 MED ORDER — HEPARIN (PORCINE) IN NACL 1000-0.9 UT/500ML-% IV SOLN
INTRAVENOUS | Status: AC
  Filled 2021-02-28: qty 1000

## 2021-02-28 MED ORDER — ASPIRIN 81 MG PO CHEW
324.0000 mg | CHEWABLE_TABLET | Freq: Once | ORAL | Status: AC
  Administered 2021-02-28: 324 mg via ORAL

## 2021-02-28 MED ORDER — SODIUM CHLORIDE 0.9 % IV SOLN: 250.0000 mL | INTRAVENOUS | Status: DC | PRN

## 2021-02-28 MED ORDER — SODIUM CHLORIDE 0.9% FLUSH
3.0000 mL | INTRAVENOUS | Status: DC | PRN
  Administered 2021-03-02: 3 mL via INTRAVENOUS

## 2021-02-28 MED ORDER — SODIUM CHLORIDE 0.9% FLUSH
3.0000 mL | Freq: Two times a day (BID) | INTRAVENOUS | Status: DC
  Administered 2021-02-28 – 2021-03-04 (×7): 3 mL via INTRAVENOUS

## 2021-02-28 MED ORDER — ALUM & MAG HYDROXIDE-SIMETH 200-200-20 MG/5ML PO SUSP
30.0000 mL | ORAL | Status: DC | PRN
  Administered 2021-02-28: 30 mL via ORAL
  Filled 2021-02-28 (×2): qty 30

## 2021-02-28 MED ORDER — HEPARIN SODIUM (PORCINE) 1000 UNIT/ML IJ SOLN
INTRAMUSCULAR | Status: DC | PRN
  Administered 2021-02-28: 7000 [IU] via INTRAVENOUS
  Administered 2021-02-28: 8000 [IU] via INTRAVENOUS

## 2021-02-28 MED ORDER — LIDOCAINE HCL (PF) 1 % IJ SOLN
INTRAMUSCULAR | Status: DC | PRN
  Administered 2021-02-28: 2 mL

## 2021-02-28 MED ORDER — POTASSIUM CHLORIDE 10 MEQ/100ML IV SOLN
10.0000 meq | INTRAVENOUS | Status: DC
  Filled 2021-02-28 (×2): qty 100

## 2021-02-28 MED ORDER — LIDOCAINE HCL 1 % IJ SOLN
INTRAMUSCULAR | Status: AC
  Filled 2021-02-28: qty 20

## 2021-02-28 MED ORDER — LABETALOL HCL 5 MG/ML IV SOLN
10.0000 mg | INTRAVENOUS | Status: AC | PRN
  Administered 2021-02-28: 10 mg via INTRAVENOUS
  Filled 2021-02-28: qty 4

## 2021-02-28 MED ORDER — HYDRALAZINE HCL 20 MG/ML IJ SOLN: 10.0000 mg | INTRAMUSCULAR | Status: AC | PRN

## 2021-02-28 MED ORDER — SODIUM CHLORIDE 0.9 % WEIGHT BASED INFUSION
1.0000 mL/kg/h | INTRAVENOUS | Status: AC
  Administered 2021-02-28: 1 mL/kg/h via INTRAVENOUS

## 2021-02-28 MED ORDER — METOPROLOL TARTRATE 25 MG PO TABS
25.0000 mg | ORAL_TABLET | Freq: Two times a day (BID) | ORAL | Status: DC
  Administered 2021-02-28 – 2021-03-03 (×8): 25 mg via ORAL
  Filled 2021-02-28 (×9): qty 1

## 2021-02-28 MED ORDER — MELATONIN 5 MG PO TABS
5.0000 mg | ORAL_TABLET | Freq: Every day | ORAL | Status: DC
  Administered 2021-02-28 – 2021-03-03 (×4): 5 mg via ORAL
  Filled 2021-02-28 (×4): qty 1

## 2021-02-28 MED ORDER — SODIUM CHLORIDE 0.9% FLUSH
3.0000 mL | Freq: Two times a day (BID) | INTRAVENOUS | Status: DC
  Administered 2021-02-28 – 2021-03-04 (×6): 3 mL via INTRAVENOUS

## 2021-02-28 MED ORDER — POTASSIUM CHLORIDE 20 MEQ PO PACK
40.0000 meq | PACK | Freq: Once | ORAL | Status: AC
  Administered 2021-02-28: 40 meq via ORAL
  Filled 2021-02-28: qty 2

## 2021-02-28 MED ORDER — IOHEXOL 350 MG/ML SOLN
INTRAVENOUS | Status: DC | PRN
  Administered 2021-02-28: 280 mL

## 2021-02-28 MED ORDER — NITROGLYCERIN 0.4 MG SL SUBL
0.4000 mg | SUBLINGUAL_TABLET | SUBLINGUAL | Status: DC | PRN
  Administered 2021-02-28 (×3): 0.4 mg via SUBLINGUAL

## 2021-02-28 MED ORDER — MAGNESIUM SULFATE 2 GM/50ML IV SOLN
2.0000 g | Freq: Once | INTRAVENOUS | Status: DC
  Filled 2021-02-28: qty 50

## 2021-02-28 MED ORDER — HEPARIN (PORCINE) 25000 UT/250ML-% IV SOLN: 850.0000 [IU]/h | INTRAVENOUS | Status: DC

## 2021-02-28 MED ORDER — ACETAMINOPHEN 325 MG PO TABS: 650.0000 mg | ORAL_TABLET | ORAL | Status: DC | PRN

## 2021-02-28 MED ORDER — TICAGRELOR 90 MG PO TABS
ORAL_TABLET | ORAL | Status: AC
  Filled 2021-02-28: qty 2

## 2021-02-28 MED ORDER — ONDANSETRON HCL 4 MG/2ML IJ SOLN: 4.0000 mg | Freq: Four times a day (QID) | INTRAMUSCULAR | Status: DC | PRN

## 2021-02-28 MED ORDER — MAGNESIUM SULFATE 2 GM/50ML IV SOLN
2.0000 g | Freq: Once | INTRAVENOUS | Status: DC
Start: 2021-02-28 — End: 2021-02-28
  Filled 2021-02-28: qty 50

## 2021-02-28 SURGICAL SUPPLY — 30 items
BALLN TREK RX 2.25X15 (BALLOONS) ×2
BALLN WOLVERINE 2.50X15 (BALLOONS) ×2
BALLN ~~LOC~~ EUPHORA RX 2.5X20 (BALLOONS) ×2
BALLN ~~LOC~~ EUPHORA RX 2.75X15 (BALLOONS) ×2
BALLN ~~LOC~~ EUPHORA RX 3.0X20 (BALLOONS) ×2
BALLN ~~LOC~~ TREK RX 3.0X12 (BALLOONS) ×2
BALLOON TREK RX 2.25X15 (BALLOONS) IMPLANT
BALLOON WOLVERINE 2.50X15 (BALLOONS) IMPLANT
BALLOON ~~LOC~~ EUPHORA RX 2.5X20 (BALLOONS) IMPLANT
BALLOON ~~LOC~~ EUPHORA RX 2.75X15 (BALLOONS) IMPLANT
BALLOON ~~LOC~~ EUPHORA RX 3.0X20 (BALLOONS) IMPLANT
BALLOON ~~LOC~~ TREK RX 3.0X12 (BALLOONS) IMPLANT
CATH INFINITI JR4 5F (CATHETERS) ×1 IMPLANT
CATH LAUNCHER 6FR EBU3.5 (CATHETERS) ×1 IMPLANT
CATH LAUNCHER 6FR JL3.5 (CATHETERS) ×1 IMPLANT
CATH SHOCKWAVE 2.5X12 (CATHETERS) IMPLANT
CATHETER SHOCKWAVE 2.5X12 (CATHETERS) ×2
DEVICE RAD TR BAND REGULAR (VASCULAR PRODUCTS) ×1 IMPLANT
GLIDESHEATH SLEND SS 6F .021 (SHEATH) ×1 IMPLANT
GUIDEWIRE INQWIRE 1.5J.035X260 (WIRE) IMPLANT
INQWIRE 1.5J .035X260CM (WIRE) ×2
KIT ENCORE 26 ADVANTAGE (KITS) ×1 IMPLANT
PACK CARDIAC CATH (CUSTOM PROCEDURE TRAY) ×2 IMPLANT
PROTECTION STATION PRESSURIZED (MISCELLANEOUS) ×2
SET ATX SIMPLICITY (MISCELLANEOUS) ×1 IMPLANT
STATION PROTECTION PRESSURIZED (MISCELLANEOUS) IMPLANT
STENT ONYX FRONTIER 2.75X22 (Permanent Stent) ×1 IMPLANT
STENT ONYX FRONTIER 3.0X22 (Permanent Stent) ×1 IMPLANT
TUBING CIL FLEX 10 FLL-RA (TUBING) ×1 IMPLANT
WIRE ASAHI PROWATER 180CM (WIRE) ×1 IMPLANT

## 2021-02-28 NOTE — H&P (Signed)
[name]   PATIENT NAME: Robert Berry    MR#:  [id number]  DATE OF BIRTH:  1966/02/04  DATE OF ADMISSION:  02/28/2021  PRIMARY CARE PHYSICIAN: Patient, No Pcp Per (Inactive)   Patient is coming from: Home  REQUESTING/REFERRING PHYSICIAN: Paraschos, Alexander, MD  CHIEF COMPLAINT:   Chief Complaint  Patient presents with   Chest Pain    HISTORY OF PRESENT ILLNESS:  Robert Berry is a 55 y.o. male with medical history significant for essential hypertension and dyslipidemia, presented to emergency room with acute onset of intermittent chest pain that is midsternal and left-sided since 3 AM.  It has been 6-12/2008 in severity and was significantly worse right before his arrival and graded 11/10.  He admitted to associated dyspnea without nausea or vomiting however he had mild diaphoresis.  ED Course: When he came to the ER, blood pressure was 171/116 and later 164/102 with respiratory rate that was up to 35 and otherwise normal vital signs.  Labs revealed mild cytosis of 12 and hypokalemia with a creatinine 1.24 and otherwise normal BMP.  High-sensitivity troponin I was 182.  Influenza antigens and COVID-19 PCR came back negative. EKG as reviewed by me : Initial EKG showed anteroseptal STEMI with ST segment elevation most remarkable in V2 through V4 with reciprocal inferior T wave inversion.  He was in normal sinus rhythm with rate of 60.  Repeat EKG showed similar findings within an hour. Imaging: Portable chest x-ray showed no acute cardiopulmonary disease.  The patient was given 4 baby aspirin, 2 sublingual nitroglycerin, IV heparin bolus and drip.  Code STEMI was activated and Dr. Pam Drown in the proximal  and mid LAD.  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  Also showed dilated cardiomyopathy with an EF 20%.  He will be admitted to an ICU bed for further evaluation and management. PAST MEDICAL HISTORY:   Past Medical History:   Diagnosis Date   Hypertension   -Dyslipidemia  PAST SURGICAL HISTORY:   Past Surgical History:  Procedure Laterality Date   facial construction L side Left     SOCIAL HISTORY:   Social History   Tobacco Use   Smoking status: Never   Smokeless tobacco: Never  Substance Use Topics   Alcohol use: No    FAMILY HISTORY:   His father died from MI at age 72.  DRUG ALLERGIES:  No Known Allergies  REVIEW OF SYSTEMS:   ROS As per history of present illness. All pertinent systems were reviewed above. Constitutional, HEENT, cardiovascular, respiratory, GI, GU, musculoskeletal, neuro, psychiatric, endocrine, integumentary and hematologic systems were reviewed and are otherwise negative/unremarkable except for positive findings mentioned above in the HPI.   MEDICATIONS AT HOME:   Prior to Admission medications   Not on File      VITAL SIGNS:  Blood pressure (!) 151/99, pulse 74, temperature 98.4 F (36.9 C), temperature source Oral, resp. rate 15, height 5\' 8"  (1.727 m), weight 68.5 kg, SpO2 100 %.  PHYSICAL EXAMINATION:  Physical Exam  GENERAL:  55 y.o.-year-old male patient lying in the bed with no acute distress.  EYES: Pupils equal, round, reactive to light and accommodation. No scleral icterus. Extraocular muscles intact.  HEENT: Head atraumatic, normocephalic. Oropharynx and nasopharynx clear.  NECK:  Supple, no jugular venous distention. No thyroid enlargement, no tenderness.  LUNGS: Normal breath sounds bilaterally, no wheezing, rales,rhonchi or crepitation. No use of accessory muscles of respiration.  CARDIOVASCULAR:  Regular rate and rhythm, S1, S2 normal. No murmurs, rubs, or gallops.  ABDOMEN: Soft, nondistended, nontender. Bowel sounds present. No organomegaly or mass.  EXTREMITIES: No pedal edema, cyanosis, or clubbing.  NEUROLOGIC: Cranial nerves II through XII are intact. Muscle strength 5/5 in all extremities. Sensation intact. Gait not checked.   PSYCHIATRIC: The patient is alert and oriented x 3.  Normal affect and good eye contact. SKIN: No obvious rash, lesion, or ulcer.   LABORATORY PANEL:   CBC Recent Labs  Lab 02/28/21 0052  WBC 12.0*  HGB 15.5  HCT 45.8  PLT 244   ------------------------------------------------------------------------------------------------------------------  Chemistries  Recent Labs  Lab 02/28/21 0052  NA 137  K 3.2*  CL 104  CO2 24  GLUCOSE 136*  BUN 19  CREATININE 1.24  CALCIUM 8.8*   ------------------------------------------------------------------------------------------------------------------  Cardiac Enzymes No results for input(s): TROPONINI in the last 168 hours. ------------------------------------------------------------------------------------------------------------------  RADIOLOGY:  DG Chest Port 1 View  Result Date: 02/28/2021 CLINICAL DATA:  Shortness of breath and chest pain. EXAM: PORTABLE CHEST 1 VIEW COMPARISON:  Chest radiograph dated 04/08/2018. FINDINGS: No focal consolidation, pleural effusion or pneumothorax. The cardiac silhouette is within limits no acute osseous pathology. IMPRESSION: No active disease. Electronically Signed   By: Elgie Collard M.D.   On: 02/28/2021 01:10      IMPRESSION AND PLAN:  Active Problems:   STEMI (ST elevation myocardial infarction) (HCC)  1.  Anteroseptal1.Marland KitchenSTEMI status post PCI and overlapping stents of proximal and mid LAD. - The patient will be admitted to an ICU bed. - She will be continued on aspirin as well as as needed IV morphine sulfate and sublingual nitroglycerin. - We will continue him on beta-blocker therapy with Lopressor as well as statin therapy with high-dose Lipitor. - 2D echo will be obtained this AM. - She will be continued on IV heparin drip.  2.  Hypertensive urgency. - This like secondary to chest pain with STEMI. - He will be continued on Lopressor. - The patient will be  placed on as needed  IV labetalol.  3.  Hypokalemia. - Potassium will be replaced and magnesium level will be checked.  4.  Mild acute kidney injury. - The patient will be hydrated with IV normal saline and will follow his BMP.  DVT prophylaxis: IV heparin. Code Status: full code. Family Communication:  The plan of care was discussed in details with the patient (and family). I answered all questions. The patient agreed to proceed with the above mentioned plan. Further management will depend upon hospital course. Disposition Plan: Back to previous home environment Consults called: Cardiology. All the records are reviewed and case discussed with ED provider.  Status is: Inpatient  Remains inpatient appropriate because:Ongoing active pain requiring inpatient pain management, Ongoing diagnostic testing needed not appropriate for outpatient work up, Unsafe d/c plan, IV treatments appropriate due to intensity of illness or inability to take PO, and Inpatient level of care appropriate due to severity of illness   Dispo: The patient is from: Home              Anticipated d/c is to: Home              Patient currently is not medically stable to d/c.   Difficult to place patient No      TOTAL TIME TAKING CARE OF THIS PATIENT: 55 minutes.    Hannah Beat M.D on 02/28/2021 at 4:04 AM  [facility]   From 7 PM-7 AM, contact  night-coverage [email or web address]  CC: Primary care physician; Patient, No Pcp Per (Inactive)

## 2021-02-28 NOTE — Progress Notes (Signed)
ANTICOAGULATION CONSULT NOTE  Pharmacy Consult for heparin infusion Indication: ACS/ code STEMI  No Known Allergies  Patient Measurements: Height: 5\' 8"  (172.7 cm) Weight: 68.5 kg (151 lb 0.2 oz) IBW/kg (Calculated) : 68.4 Heparin Dosing Weight: 68.5 kg  Vital Signs: Temp: 98.4 F (36.9 C) (09/23 0100) Temp Source: Oral (09/23 0100) BP: 171/116 (09/23 0055) Pulse Rate: 81 (09/23 0055)  Labs: No results for input(s): HGB, HCT, PLT, APTT, LABPROT, INR, HEPARINUNFRC, HEPRLOWMOCWT, CREATININE, CKTOTAL, CKMB, TROPONINIHS in the last 72 hours.  CrCl cannot be calculated (Patient's most recent lab result is older than the maximum 21 days allowed.).   Medical History: Past Medical History:  Diagnosis Date   Hypertension    Assessment: Pt is 55 yo male presenting with CP, code STEMI initiated.  Goal of Therapy:  Heparin level 0.3-0.7 units/ml Monitor platelets by anticoagulation protocol: Yes   Plan:  Pt given heparin 4000 units bolus x 1 Start heparin infusion at 850 units/hr Will check HL 6 hr after start of infusion CBC daily while on heparin.  53, PharmD, MBA 02/28/2021 1:13 AM

## 2021-02-28 NOTE — ED Notes (Signed)
Called [facility] ( [place]) ... STEMI

## 2021-02-28 NOTE — Consult Note (Signed)
[facility] Cardiology  CARDIOLOGY CONSULT NOTE  Patient ID: Robert Berry MRN: [id number] DOB/AGE: 11/21/1965 55 y.o.  Admit date: 02/28/2021 Referring Physician [facility] Primary Physician  Primary Cardiologist  Reason for Consultation anterior ST elevation myocardial infarction  HPI: 55 year old gentleman referred for evaluation of anterior ST elevation myocardial infarction.  His usual state of health until 3:30 in the afternoon on 02/27/2021 when he developed substernal chest tightness associated with diaphoresis and nausea.  Patient was at work, where he first noted chest pain which was exacerbated by exertion such as walking up steps or ladders.  The patient went home, experience persistent chest pain and presented to [facility] ED.  ECG revealed sinus rhythm with diagnostic ST elevation in leads V2 through V5.  The patient has a history of essential hypertension not on any medications.  He denies tobacco abuse.  He does smoke marijuana on most days.  Review of systems complete and found to be negative unless listed above     Past Medical History:  Diagnosis Date   Hypertension     Past Surgical History:  Procedure Laterality Date   facial construction L side Left     (Not in a hospital admission)  Social History   Socioeconomic History   Marital status: Married    Spouse name: Not on file   Number of children: Not on file   Years of education: Not on file   Highest education level: Not on file  Occupational History   Not on file  Tobacco Use   Smoking status: Never   Smokeless tobacco: Never  Substance and Sexual Activity   Alcohol use: No   Drug use: No   Sexual activity: Not on file  Other Topics Concern   Not on file  Social History Narrative   Not on file   Social Determinants of Health   Financial Resource Strain: Not on file  Food Insecurity: Not on file  Transportation Needs: Not on file  Physical Activity: Not on file  Stress: Not on file  Social  Connections: Not on file  Intimate Partner Violence: Not on file    No family history on file.    Review of systems complete and found to be negative unless listed above      PHYSICAL EXAM  General: Well developed, well nourished, in no acute distress HEENT:  Normocephalic and atramatic Neck:  No JVD.  Lungs: Clear bilaterally to auscultation and percussion. Heart: HRRR . Normal S1 and S2 without gallops or murmurs.  Abdomen: Bowel sounds are positive, abdomen soft and non-tender  Msk:  Back normal, normal gait. Normal strength and tone for age. Extremities: No clubbing, cyanosis or edema.   Neuro: Alert and oriented X 3. Psych:  Good affect, responds appropriately  Labs:   Lab Results  Component Value Date   WBC 12.0 (H) 02/28/2021   HGB 15.5 02/28/2021   HCT 45.8 02/28/2021   MCV 89.1 02/28/2021   PLT 244 02/28/2021    Recent Labs  Lab 02/28/21 0052  NA 137  K 3.2*  CL 104  CO2 24  BUN 19  CREATININE 1.24  CALCIUM 8.8*  GLUCOSE 136*   No results found for: CKTOTAL, CKMB, CKMBINDEX, TROPONINI No results found for: CHOL No results found for: HDL No results found for: LDLCALC No results found for: TRIG No results found for: CHOLHDL No results found for: LDLDIRECT    Radiology: [place] Chest Port 1 View  Result Date: 02/28/2021 CLINICAL DATA:  Shortness of  breath and chest pain. EXAM: PORTABLE CHEST 1 VIEW COMPARISON:  Chest radiograph dated 04/08/2018. FINDINGS: No focal consolidation, pleural effusion or pneumothorax. The cardiac silhouette is within limits no acute osseous pathology. IMPRESSION: No active disease. Electronically Signed   By: Elgie Collard M.D.   On: 02/28/2021 01:10    EKG: Sinus rhythm with ST elevation in leads V2 through V5  ASSESSMENT AND PLAN:   1.  Anterior ST elevation myocardial infarction, with new onset chest pain, ST elevation leads V2 through V5 2.  Essential hypertension, blood pressure elevated  Recommendations  Emergent  cardiac catheterization and probable primary PCI   Signed: Marcina Millard MD,PhD, [facility] 02/28/2021, 1:33 AM

## 2021-02-28 NOTE — ED Notes (Signed)
Dr. Mansy at bedside. 

## 2021-02-28 NOTE — ED Provider Notes (Addendum)
[facility] Emergency Department Provider Note  ____________________________________________  Time seen: Approximately 1:08 AM  I have reviewed the triage vital signs and the nursing notes.   HISTORY  Chief Complaint Chest Pain   HPI Robert Berry is a 55 y.o. male with a history of hypertension who presents for evaluation of chest pain.  Patient reports that since this afternoon he has been having on and off chest pain that he describes as severe heaviness in the center of his chest associated with diaphoresis and nausea.  He reports that he was working climbing up and down ladders and that is when the pain was worse.  The pain got better with rest.  This evening the pain was not subsided which prompted visit to the emergency room.  He also has shortness of breath associated with it.  He denies any history of heart attack.  Denies any history of smoking although reports smoking pot 3 times a day.  He reports family history of heart attack in his father in his 79s.  He denies pleuritic chest pain, sharp tearing pain, upper back pain, numbness or tingling of his extremities.  He reports that his pain is 10 out of 10   Past Medical History:  Diagnosis Date   Hypertension     There are no problems to display for this patient.   Past Surgical History:  Procedure Laterality Date   facial construction L side Left     Prior to Admission medications   Medication Sig Start Date End Date Taking? Authorizing Provider  amoxicillin-clavulanate (AUGMENTIN) 875-125 MG tablet Take 1 tablet by mouth 2 (two) times daily. 06/25/17   Triplett, Rulon Eisenmenger B, FNP  azithromycin (ZITHROMAX) 250 MG tablet Take 1 tablet (250 mg total) by mouth daily. Take first 2 tablets together, then 1 every day until finished. 11/08/12   Junious Silk, PA-C  cetirizine-pseudoephedrine (ZYRTEC-D) 5-120 MG per tablet Take 1 tablet by mouth 2 (two) times daily.    [provider]   fluticasone (FLONASE) 50 MCG/ACT nasal spray Place 2 sprays into the nose daily. 11/08/12   Junious Silk, PA-C  ibuprofen (ADVIL,MOTRIN) 600 MG tablet Take 1 tablet (600 mg total) by mouth every 8 (eight) hours as needed. 04/08/18   Joni Reining, PA-C  oxyCODONE-acetaminophen (PERCOCET) 7.5-325 MG tablet Take 1 tablet by mouth every 6 (six) hours as needed for severe pain. 04/08/18   Joni Reining, PA-C  oxyCODONE-acetaminophen (ROXICET) 5-325 MG tablet Take 1 tablet by mouth every 6 (six) hours as needed. 02/19/16   Minna Antis, MD  predniSONE (DELTASONE) 20 MG tablet 3 tabs po day one, then 2 po daily x 4 days 11/08/12   Junious Silk, PA-C    Allergies Patient has no known allergies.  No family history on file.  Social History Social History   Tobacco Use   Smoking status: Never   Smokeless tobacco: Never  Substance Use Topics   Alcohol use: No   Drug use: No    Review of Systems  Constitutional: Negative for fever. Eyes: Negative for visual changes. ENT: Negative for sore throat. Neck: No neck pain  Cardiovascular: + chest pain. Respiratory: + shortness of breath. Gastrointestinal: Negative for abdominal pain, vomiting or diarrhea. Genitourinary: Negative for dysuria. Musculoskeletal: Negative for back pain. Skin: Negative for rash. Neurological: Negative for headaches, weakness or numbness. Psych: No SI or HI  ____________________________________________   PHYSICAL EXAM:  VITAL SIGNS: ED Triage Vitals  Enc Vitals Group  BP 02/28/21 0055 (!) 171/116     Pulse Rate 02/28/21 0055 81     Resp 02/28/21 0055 (!) 35     Temp 02/28/21 0055 98.4 F (36.9 C)     Temp Source 02/28/21 0055 Oral     SpO2 02/28/21 0055 99 %     Weight 02/28/21 0051 151 lb 0.2 oz (68.5 kg)     Height 02/28/21 0051 5\' 8"  (1.727 m)     Head Circumference --      Peak Flow --      Pain Score --      Pain Loc --      Pain Edu? --      Excl. in GC? --     Constitutional:  Alert and oriented. Anxious appearing in no distress. HEENT:      Head: Normocephalic and atraumatic.         Eyes: Conjunctivae are normal. Sclera is non-icteric.       Mouth/Throat: Mucous membranes are moist.       Neck: Supple with no signs of meningismus. Cardiovascular: Regular rate and rhythm. No murmurs, gallops, or rubs. 2+ symmetrical distal pulses are present in all extremities. No JVD. Respiratory: Normal respiratory effort. Lungs are clear to auscultation bilaterally.  Gastrointestinal: Soft, non tender, and non distended with positive bowel sounds. No rebound or guarding. Genitourinary: No CVA tenderness. Musculoskeletal:  No edema, cyanosis, or erythema of extremities. Neurologic: Normal speech and language. Face is symmetric. Moving all extremities. No gross focal neurologic deficits are appreciated. Skin: Skin is warm, dry and intact. No rash noted. Psychiatric: Mood and affect are normal. Speech and behavior are normal.  ____________________________________________   LABS (all labs ordered are listed, but only abnormal results are displayed)  Labs Reviewed  CBC - Abnormal; Notable for the following components:      Result Value   WBC 12.0 (*)    All other components within normal limits  RESP PANEL BY RT-PCR (FLU A&B, COVID) ARPGX2  BASIC METABOLIC PANEL  APTT  PROTIME-INR  TROPONIN I (HIGH SENSITIVITY)   ____________________________________________  EKG  ED ECG REPORT I, , the attending physician, personally viewed and interpreted this ECG.  Normal sinus rhythm with ST elevation anterior lateral with reciprocal changes. ____________________________________________  RADIOLOGY  I have personally reviewed the images performed during this visit and I agree with the Radiologist's read.   Interpretation by Radiologist:  No results found.   ____________________________________________   PROCEDURES  Procedure(s) performed:yes .1-3  Lead EKG Interpretation Performed by: Nita Sickle, MD Authorized by: Nita Sickle, MD     Interpretation: abnormal     ECG rate assessment: normal     Rhythm: sinus rhythm     Ectopy: none     Conduction: normal     Critical Care performed: yes  CRITICAL CARE Performed by: Nita Sickle  ?  Total critical care time: 30 min  Critical care time was exclusive of separately billable procedures and treating other patients.  Critical care was necessary to treat or prevent imminent or life-threatening deterioration.  Critical care was time spent personally by me on the following activities: development of treatment plan with patient and/or surrogate as well as nursing, discussions with consultants, evaluation of patient's response to treatment, examination of patient, obtaining history from patient or surrogate, ordering and performing treatments and interventions, ordering and review of laboratory studies, ordering and review of radiographic studies, pulse oximetry and re-evaluation of patient's condition.  ____________________________________________  INITIAL IMPRESSION / ASSESSMENT AND PLAN / ED COURSE  55 y.o. male with a history of hypertension who presents for evaluation of chest pain.  Patient arrives in the ED with several hours of intermittent substernal chest pressure associated with shortness of breath and diaphoresis.  EKG consistent with a STEMI.  Code STEMI activated on arrival.  Patient given a full aspirin, 4000 units of heparin and 2 sublingual nitros with full resolution of the pain.  Discussed the case with Dr. Darrold Junker on-call for the Cath Lab who will be coming in to take patient stat to the Cath Lab.  Updated patient's wife over the phone per patient's request.  Labs pending.  Patient placed on telemetry for monitoring of cardiorespiratory status.  Old medical records reviewed.  _________________________ 1:27 AM on  02/28/2021 ----------------------------------------- Dr. Darrold Junker at bedside.     _____________________________________________ Please note:  Patient was evaluated in Emergency Department today for the symptoms described in the history of present illness. Patient was evaluated in the context of the global COVID-19 pandemic, which necessitated consideration that the patient might be at risk for infection with the SARS-CoV-2 virus that causes COVID-19. Institutional protocols and algorithms that pertain to the evaluation of patients at risk for COVID-19 are in a state of rapid change based on information released by regulatory bodies including the CDC and federal and state organizations. These policies and algorithms were followed during the patient's care in the ED.  Some ED evaluations and interventions may be delayed as a result of limited staffing during the pandemic.   [place] Controlled Substance Database was reviewed by me. ____________________________________________   FINAL CLINICAL IMPRESSION(S) / ED DIAGNOSES   Final diagnoses:  ST elevation myocardial infarction (STEMI), unspecified artery (HCC)      NEW MEDICATIONS STARTED DURING THIS VISIT:  ED Discharge Orders     None        Note:  This document was prepared using Dragon voice recognition software and may include unintentional dictation errors.    Don Perking, [place], MD 02/28/21 David Stall    Don Perking, [place], MD 02/28/21 [phone]

## 2021-02-28 NOTE — ED Notes (Signed)
Cardiologist at bedside.  

## 2021-02-28 NOTE — H&P (View-Only) (Signed)
[facility] Cardiology  CARDIOLOGY CONSULT NOTE  Patient ID: Robert Berry MRN: [id number] DOB/AGE: 11/21/1965 55 y.o.  Admit date: 02/28/2021 Referring Physician [facility] Primary Physician  Primary Cardiologist  Reason for Consultation anterior ST elevation myocardial infarction  HPI: 55 year old gentleman referred for evaluation of anterior ST elevation myocardial infarction.  His usual state of health until 3:30 in the afternoon on 02/27/2021 when he developed substernal chest tightness associated with diaphoresis and nausea.  Patient was at work, where he first noted chest pain which was exacerbated by exertion such as walking up steps or ladders.  The patient went home, experience persistent chest pain and presented to [facility] ED.  ECG revealed sinus rhythm with diagnostic ST elevation in leads V2 through V5.  The patient has a history of essential hypertension not on any medications.  He denies tobacco abuse.  He does smoke marijuana on most days.  Review of systems complete and found to be negative unless listed above     Past Medical History:  Diagnosis Date   Hypertension     Past Surgical History:  Procedure Laterality Date   facial construction L side Left     (Not in a hospital admission)  Social History   Socioeconomic History   Marital status: Married    Spouse name: Not on file   Number of children: Not on file   Years of education: Not on file   Highest education level: Not on file  Occupational History   Not on file  Tobacco Use   Smoking status: Never   Smokeless tobacco: Never  Substance and Sexual Activity   Alcohol use: No   Drug use: No   Sexual activity: Not on file  Other Topics Concern   Not on file  Social History Narrative   Not on file   Social Determinants of Health   Financial Resource Strain: Not on file  Food Insecurity: Not on file  Transportation Needs: Not on file  Physical Activity: Not on file  Stress: Not on file  Social  Connections: Not on file  Intimate Partner Violence: Not on file    No family history on file.    Review of systems complete and found to be negative unless listed above      PHYSICAL EXAM  General: Well developed, well nourished, in no acute distress HEENT:  Normocephalic and atramatic Neck:  No JVD.  Lungs: Clear bilaterally to auscultation and percussion. Heart: HRRR . Normal S1 and S2 without gallops or murmurs.  Abdomen: Bowel sounds are positive, abdomen soft and non-tender  Msk:  Back normal, normal gait. Normal strength and tone for age. Extremities: No clubbing, cyanosis or edema.   Neuro: Alert and oriented X 3. Psych:  Good affect, responds appropriately  Labs:   Lab Results  Component Value Date   WBC 12.0 (H) 02/28/2021   HGB 15.5 02/28/2021   HCT 45.8 02/28/2021   MCV 89.1 02/28/2021   PLT 244 02/28/2021    Recent Labs  Lab 02/28/21 0052  NA 137  K 3.2*  CL 104  CO2 24  BUN 19  CREATININE 1.24  CALCIUM 8.8*  GLUCOSE 136*   No results found for: CKTOTAL, CKMB, CKMBINDEX, TROPONINI No results found for: CHOL No results found for: HDL No results found for: LDLCALC No results found for: TRIG No results found for: CHOLHDL No results found for: LDLDIRECT    Radiology: [facility] Chest Port 1 View  Result Date: 02/28/2021 CLINICAL DATA:  Shortness of  breath and chest pain. EXAM: PORTABLE CHEST 1 VIEW COMPARISON:  Chest radiograph dated 04/08/2018. FINDINGS: No focal consolidation, pleural effusion or pneumothorax. The cardiac silhouette is within limits no acute osseous pathology. IMPRESSION: No active disease. Electronically Signed   By: Elgie Collard M.D.   On: 02/28/2021 01:10    EKG: Sinus rhythm with ST elevation in leads V2 through V5  ASSESSMENT AND PLAN:   1.  Anterior ST elevation myocardial infarction, with new onset chest pain, ST elevation leads V2 through V5 2.  Essential hypertension, blood pressure elevated  Recommendations  Emergent  cardiac catheterization and probable primary PCI   Signed: Marcina Millard MD,PhD, [facility] 02/28/2021, 1:33 AM

## 2021-02-28 NOTE — ED Triage Notes (Signed)
Pt in with co chest pain since yesterday, denies any cardiac history. States has been under a lot of stress recently.

## 2021-02-28 NOTE — Progress Notes (Signed)
Patient transferred to 2A bed 248;   Pt alert and oriented x 4; Sinus Rhythm on the monitor;  Room air;  Vitals stable; No complaints of pain upon transfer;  Report given to RN.    KRAY, RN, BSN

## 2021-02-28 NOTE — Progress Notes (Signed)
*  PRELIMINARY RESULTS* Echocardiogram 2D Echocardiogram has been performed.  Orlan Aversa 02/28/2021, 8:44 AM 

## 2021-02-28 NOTE — Progress Notes (Addendum)
1215 started removing TR band air 2cc 1230 removed 2cc 1245 removed last 2 cc 1308 TR band off small soft hematoma noted below TR band placement no bleeding noted pt educated on not using arm no pulling pushing or excessive bending of the wrist also to notify staff if bleeding occurs. Patient alert x4,

## 2021-02-28 NOTE — Progress Notes (Signed)
Brief hospitalist update note.  This is a nonbillable note.  Please see same-day H&P from Dr. Arville Care for full billable details.  Briefly, this is a 55 year old male with medical history of dyslipidemia and essential hypertension.  Strong family history of coronary artery disease who presented to the ED with acute onset of intermittent chest pain.  Presentation on arrival demonstrated marked hypertension and tachypnea.  High-sensitivity troponin initially 182.  EKG with anteroseptal STEMI with ST elevation in V2 through V4 with reciprocal T wave inversions.  Code STEMI activated.  Patient taken emergently to Cath Lab.  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  Also had dilated cardiomyopathy with EF 20%.  Underwent catheterization and PCI with DES deployed to LAD.  Per cardiology plan for return to Cath Lab on Monday 9/26 for two-stage intervention.  Patient is hemodynamically stable and chest pain-free at time of my evaluation.  Plan: Transfer to PCU Continue telemetry monitoring Continue antiplatelet agents Vitals per unit protocol Cardiology follow-up  Lolita Patella MD

## 2021-03-01 LAB — BASIC METABOLIC PANEL
Anion gap: 10 (ref 5–15)
BUN: 13 mg/dL (ref 6–20)
CO2: 26 mmol/L (ref 22–32)
Calcium: 9.1 mg/dL (ref 8.9–10.3)
Chloride: 99 mmol/L (ref 98–111)
Creatinine, Ser: 1.18 mg/dL (ref 0.61–1.24)
GFR, Estimated: >60 mL/min (ref >60)
Glucose, Bld: 112 mg/dL — ABNORMAL HIGH (ref 70–99)
Potassium: 4.4 mmol/L (ref 3.5–5.1)
Sodium: 135 mmol/L (ref 135–145)

## 2021-03-01 LAB — MAGNESIUM: Magnesium: 2.1 mg/dL (ref 1.7–2.4)

## 2021-03-01 MED ORDER — LOSARTAN POTASSIUM 25 MG PO TABS
12.5000 mg | ORAL_TABLET | Freq: Every day | ORAL | Status: DC
  Administered 2021-03-01 – 2021-03-04 (×4): 12.5 mg via ORAL
  Filled 2021-03-01 (×4): qty 1

## 2021-03-01 MED ORDER — ENOXAPARIN SODIUM 40 MG/0.4ML IJ SOSY
40.0000 mg | PREFILLED_SYRINGE | INTRAMUSCULAR | Status: DC
  Administered 2021-03-01 – 2021-03-03 (×3): 40 mg via SUBCUTANEOUS
  Filled 2021-03-01 (×3): qty 0.4

## 2021-03-01 NOTE — Progress Notes (Signed)
[facility] Cardiology  CARDIOLOGY CONSULT NOTE  Patient ID: [name] MRN: [id number] DOB/AGE: 01-04-1966 55 y.o.  Admit date: 02/28/2021 Referring Physician [facility] Primary Physician  Primary Cardiologist  Reason for Consultation anterior ST elevation myocardial infarction  HPI: 55 year old gentleman referred for evaluation of anterior ST elevation myocardial infarction.  His usual state of health until 3:30 in the afternoon on 02/27/2021 when he developed substernal chest tightness associated with diaphoresis and nausea. S/p PCI to LAD (heavily calcified, two overlapping DES), with residual disease in RCA. EF 25% with anteroapical akinesis.   Interval history: - he says he feels "great." - Did lots of walking in the halls yesterday without shortness of breath or chest pain.  - Does complain of some orthopnea which is bothersome.   Review of systems complete and found to be negative unless listed above     Past Medical History:  Diagnosis Date   Hypertension     Past Surgical History:  Procedure Laterality Date   CORONARY/GRAFT ACUTE MI REVASCULARIZATION N/A 02/28/2021   Procedure: Coronary/Graft Acute MI Revascularization;  Surgeon: Marcina Millard, MD;  Location: [facility] INVASIVE CV LAB;  Service: Cardiovascular;  Laterality: N/A;   facial construction L side Left    LEFT HEART CATH AND CORONARY ANGIOGRAPHY N/A 02/28/2021   Procedure: LEFT HEART CATH AND CORONARY ANGIOGRAPHY;  Surgeon: Marcina Millard, MD;  Location: [facility] INVASIVE CV LAB;  Service: Cardiovascular;  Laterality: N/A;    No medications prior to admission.    Social History   Socioeconomic History   Marital status: Married    Spouse name: Not on file   Number of children: Not on file   Years of education: Not on file   Highest education level: Not on file  Occupational History   Not on file  Tobacco Use   Smoking status: Never   Smokeless tobacco: Never  Substance and Sexual Activity   Alcohol  use: No   Drug use: No   Sexual activity: Not on file  Other Topics Concern   Not on file  Social History Narrative   Not on file   Social Determinants of Health   Financial Resource Strain: Not on file  Food Insecurity: Not on file  Transportation Needs: Not on file  Physical Activity: Not on file  Stress: Not on file  Social Connections: Not on file  Intimate Partner Violence: Not on file    No family history on file.    Review of systems complete and found to be negative unless listed above      PHYSICAL EXAM  General: Well developed, well nourished, in no acute distress HEENT:  Normocephalic and atramatic Neck:  No JVD.  Lungs: Clear bilaterally to auscultation and percussion. Heart: HRRR . Normal S1 and S2 without gallops or murmurs.  Abdomen: Bowel sounds are positive, abdomen soft and non-tender  Msk:  Back normal, normal gait. Normal strength and tone for age. Extremities: No clubbing, cyanosis or edema.   Neuro: Alert and oriented X 3. Psych:  Good affect, responds appropriately  Labs:   Lab Results  Component Value Date   WBC 13.4 (H) 02/28/2021   HGB 14.3 02/28/2021   HCT 40.8 02/28/2021   MCV 87.2 02/28/2021   PLT 193 02/28/2021    Recent Labs  Lab 02/28/21 0557  NA 134*  K 3.7  CL 102  CO2 22  BUN 14  CREATININE 0.96  CALCIUM 8.4*  GLUCOSE 136*    No results found for: CKTOTAL, CKMB,  CKMBINDEX, TROPONINI No results found for: CHOL No results found for: HDL No results found for: LDLCALC No results found for: TRIG No results found for: CHOLHDL No results found for: LDLDIRECT    Radiology: CARDIAC CATHETERIZATION  Result Date: 02/28/2021   Prox RCA lesion is 95% stenosed.   Prox RCA to Mid RCA lesion is 30% stenosed.   RPDA lesion is 50% stenosed.   Prox LAD to Mid LAD lesion is 95% stenosed.   Prox LAD lesion is 95% stenosed.   Prox Cx lesion is 50% stenosed.   Mid LAD lesion is 30% stenosed.   Dist LAD lesion is 30% stenosed.   A  drug-eluting stent was successfully placed using a STENT ONYX FRONTIER 2.75X22.   A drug-eluting stent was successfully placed using a STENT ONYX FRONTIER 3.0X22.   Post intervention, there is a 0% residual stenosis.   Post intervention, there is a 0% residual stenosis.   There is severe left ventricular systolic dysfunction.   The left ventricular ejection fraction is less than 25% by visual estimate. 1.  Anterior ST elevation myocardial infarction 2.  95% complex, heavily calcified proximal/mid LAD stenosis likely culprit lesion 3.  95% stenosis proximal RCA 4.  Dilated cardiomyopathy with severely reduced left ventricular function 5.  Successful primary PCI with overlapping DES proximal and mid LAD Recommendations 1.  Dual antiplatelet therapy uninterrupted for 1 year 2.  Start high intensity atorvastatin 80 mg daily 3.  Start metoprolol tartrate 25 mg twice daily 4.  2D echocardiogram 5.  Stage PCI RCA for 03/03/2021   [facility] Chest Port 1 View  Result Date: 02/28/2021 CLINICAL DATA:  Shortness of breath and chest pain. EXAM: PORTABLE CHEST 1 VIEW COMPARISON:  Chest radiograph dated 04/08/2018. FINDINGS: No focal consolidation, pleural effusion or pneumothorax. The cardiac silhouette is within limits no acute osseous pathology. IMPRESSION: No active disease. Electronically Signed   By: Elgie Collard M.D.   On: 02/28/2021 01:10   ECHOCARDIOGRAM COMPLETE  Result Date: 02/28/2021    ECHOCARDIOGRAM REPORT   Patient Name:   Robert Berry Date of Exam: 02/28/2021 Medical Rec #:  [id number]          Height:       68.0 in Accession #:    [id number]         Weight:       146.2 lb Date of Birth:  23-Jul-1965           BSA:          1.789 m Patient Age:    55 years           BP:           108/90 mmHg Patient Gender: M                  HR:           85 bpm. Exam Location:  [facility] Procedure: 2D Echo, Cardiac Doppler, Color Doppler and Strain Analysis Indications:     Acute myocardial infarction--Unspecified I21.9   History:         Patient has no prior history of Echocardiogram examinations.                  Risk Factors:Hypertension.  Sonographer:     Cristela Blue Referring Phys:  [id number] Lyn Hollingshead PARASCHOS Diagnosing Phys: Sena Slate  Sonographer Comments: Global longitudinal strain was attempted. IMPRESSIONS  1. Left ventricular ejection fraction, by estimation, is 25 to 30%. The left  ventricle has severely decreased function. The left ventricle demonstrates regional wall motion abnormalities (see scoring diagram/findings for description). Left ventricular diastolic parameters are consistent with Grade I diastolic dysfunction (impaired relaxation).  2. Right ventricular systolic function is normal. The right ventricular size is normal.  3. The mitral valve is normal in structure. Mild mitral valve regurgitation.  4. The aortic valve is normal in structure. Aortic valve regurgitation is not visualized. No aortic stenosis is present.  5. Aortic dilatation noted. There is mild dilatation of the ascending aorta, measuring 38 mm. FINDINGS  Left Ventricle: Left ventricular ejection fraction, by estimation, is 25 to 30%. The left ventricle has severely decreased function. The left ventricle demonstrates regional wall motion abnormalities. The left ventricular internal cavity size was normal  in size. There is no left ventricular hypertrophy. Left ventricular diastolic parameters are consistent with Grade I diastolic dysfunction (impaired relaxation).  LV Wall Scoring: The mid and distal anterior wall, mid and distal anterior septum, mid inferoseptal segment, and apex are akinetic. The apical lateral segment is normal. Right Ventricle: The right ventricular size is normal. No increase in right ventricular wall thickness. Right ventricular systolic function is normal. Left Atrium: Left atrial size was normal in size. Right Atrium: Right atrial size was normal in size. Pericardium: There is no evidence of pericardial effusion. Mitral  Valve: The mitral valve is normal in structure. Mild mitral valve regurgitation. Tricuspid Valve: The tricuspid valve is normal in structure. Tricuspid valve regurgitation is trivial. Aortic Valve: The aortic valve is normal in structure. Aortic valve regurgitation is not visualized. No aortic stenosis is present. Aortic valve mean gradient measures 2.0 mmHg. Aortic valve peak gradient measures 3.7 mmHg. Aortic valve area, by VTI measures 2.50 cm. Pulmonic Valve: The pulmonic valve was not well visualized. Pulmonic valve regurgitation is not visualized. Aorta: Aortic dilatation noted. There is mild dilatation of the ascending aorta, measuring 38 mm. IAS/Shunts: The interatrial septum was not assessed.  LEFT VENTRICLE PLAX 2D LVIDd:         4.90 cm      Diastology LVIDs:         4.10 cm      LV e' medial:    5.22 cm/s LV PW:         0.90 cm      LV E/e' medial:  13.1 LV IVS:        0.75 cm      LV e' lateral:   7.83 cm/s LVOT diam:     2.00 cm      LV E/e' lateral: 8.8 LV SV:         39 LV SV Index:   22 LVOT Area:     3.14 cm  LV Volumes (MOD) LV vol d, MOD A2C: 98.7 ml LV vol d, MOD A4C: 106.0 ml LV vol s, MOD A2C: 56.9 ml LV vol s, MOD A4C: 66.7 ml LV SV MOD A2C:     41.8 ml LV SV MOD A4C:     106.0 ml LV SV MOD BP:      42.1 ml RIGHT VENTRICLE RV Basal diam:  2.50 cm RV S prime:     10.90 cm/s TAPSE (M-mode): 3.8 cm LEFT ATRIUM           Index       RIGHT ATRIUM           Index LA diam:      4.00 cm 2.24 cm/m  RA Area:  10.60 cm LA Vol (A2C): 56.8 ml 31.73 ml/m RA Volume:   22.70 ml  12.69 ml/m LA Vol (A4C): 18.9 ml 10.57 ml/m  AORTIC VALVE                   PULMONIC VALVE AV Area (Vmax):    2.32 cm    PV Vmax:        1.10 m/s AV Area (Vmean):   2.28 cm    PV Peak grad:   4.8 mmHg AV Area (VTI):     2.50 cm    RVOT Peak grad: 3 mmHg AV Vmax:           96.10 cm/s AV Vmean:          65.300 cm/s AV VTI:            0.157 m AV Peak Grad:      3.7 mmHg AV Mean Grad:      2.0 mmHg LVOT Vmax:         71.10  cm/s LVOT Vmean:        47.400 cm/s LVOT VTI:          0.125 m LVOT/AV VTI ratio: 0.80  AORTA Ao Root diam: 3.43 cm MITRAL VALVE               TRICUSPID VALVE MV Area (PHT): 5.16 cm    TR Peak grad:   23.0 mmHg MV Decel Time: 147 msec    TR Vmax:        240.00 cm/s MV E velocity: 68.60 cm/s MV A velocity: 50.60 cm/s  SHUNTS MV E/A ratio:  1.36        Systemic VTI:  0.12 m                            Systemic Diam: 2.00 cm Sena Slate Electronically signed by Sena Slate Signature Date/Time: 02/28/2021/9:09:38 AM    Final     EKG: Sinus rhythm with ST elevation in leads V2 through V5  ASSESSMENT AND PLAN:   1.  Anterior ST elevation myocardial infarction, with new onset chest pain, ST elevation leads V2 through V5- s/p DES x 2 to mid LAD 2. CAD with residual high grade stenosis in proximal RCA 3. Ischemic cardiomyopathy with EF 25% with anterior akinesis 4.  Essential hypertension, blood pressure elevated  Recommendations  - Continue aspirin and ticagrelor 90 mg BID for 12 months--> Patient does not have insurance. May need to switch to plavix prior to discharge. - Continue lipitor 80 mg daily - Continue metoprlol 25 mg BID - Start Losartan 12.5 mg daily - Plan for PCI to proximal RCA Monday. NPO at midnight Monday morning.  - please check daily renal function while inpatient; CBC Monday morning - Discussed lifevest at discharge. Will need to investigate Monday/Tuesday, though patient does not have insurance.    Signed: Armando Reichert MD 03/01/2021, 8:36 AM

## 2021-03-01 NOTE — Plan of Care (Signed)
  Problem: Education: Goal: Knowledge of General Education information will improve Description: Including pain rating scale, medication(s)/side effects and non-pharmacologic comfort measures Outcome: Progressing   Problem: Clinical Measurements: Goal: Cardiovascular complication will be avoided Outcome: Progressing   Problem: Activity: Goal: Risk for activity intolerance will decrease Outcome: Not Progressing  Heart rate up with exertion 

## 2021-03-01 NOTE — Progress Notes (Signed)
PROGRESS NOTE    Robert Berry  DVV:[id number] DOB: 1965-07-31 DOA: 02/28/2021 PCP: Patient, No Pcp Per (Inactive)    Brief Narrative:  55 year old male with medical history of dyslipidemia and essential hypertension.  Strong family history of coronary artery disease who presented to the ED with acute onset of intermittent chest pain.  Presentation on arrival demonstrated marked hypertension and tachypnea.  High-sensitivity troponin initially 182.  EKG with anteroseptal STEMI with ST elevation in V2 through V4 with reciprocal T wave inversions.  Code STEMI activated.  Patient taken emergently to Cath Lab.  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  Also had dilated cardiomyopathy with EF 20%.  Underwent catheterization and PCI with DES deployed to LAD.   Per cardiology plan for return to Cath Lab on Monday 9/26 for two-stage intervention.  Patient is hemodynamically stable and chest pain-free at time of my evaluation.   Assessment & Plan:   Active Problems:   STEMI (ST elevation myocardial infarction) (HCC)  Acute STEMI Multivessel coronary artery disease Ischemic cardiomyopathy EF 25% Essential hypertension Patient with strong family history of early cardiac death Plan: Continue DAPT, currently on aspirin and ticagrelor Patient uninsured, may need switch to Plavix prior to discharge High intensity statin Metoprolol 25 twice daily Losartan 12.5 mg daily Plan for return to Cath Lab for 2 staged intervention on Monday. Possible LifeVest on discharge the patient uninsured and may be challenging   DVT prophylaxis: SCD Code Status: Full Family Communication: None today Disposition Plan: Status is: Inpatient  Remains inpatient appropriate because:Inpatient level of care appropriate due to severity of illness  Dispo: The patient is from: Home              Anticipated d/c is to: Home              Patient currently is not medically stable to d/c.    Difficult to place patient No       Level of care: Progressive Cardiac  Consultants:  Cardiology  Procedures:  Cardiac catheterization with PCI 9/22  Antimicrobials:  Not   Subjective: Seen and examined.  States he feels great.  Has been ambulating around hallways.  No distress.  No chest pain  Objective: Vitals:   02/28/21 2335 03/01/21 0445 03/01/21 0803 03/01/21 1144  BP: 127/82 (!) 116/95 (!) 127/91 131/87  Pulse: 75 88 96 98  Resp: 18 17 17 19   Temp: 98.1 F (36.7 C) 98.1 F (36.7 C) 97.9 F (36.6 C) 97.6 F (36.4 C)  TempSrc:      SpO2: 96% 99% 100% 99%  Weight:      Height:        Intake/Output Summary (Last 24 hours) at 03/01/2021 1154 Last data filed at 03/01/2021 0950 Gross per 24 hour  Intake 805.5 ml  Output 1650 ml  Net -844.5 ml   Filed Weights   02/28/21 0051 02/28/21 0427  Weight: 68.5 kg 66.3 kg    Examination:  General exam: Appears calm and comfortable  Respiratory system: Clear to auscultation. Respiratory effort normal. Cardiovascular system: S1 & S2 heard, RRR. No JVD, murmurs, rubs, gallops or clicks. No pedal edema. Gastrointestinal system: Abdomen is nondistended, soft and nontender. No organomegaly or masses felt. Normal bowel sounds heard. Central nervous system: Alert and oriented. No focal neurological deficits. Extremities: Symmetric 5 x 5 power. Skin: No rashes, lesions or ulcers Psychiatry: Judgement and insight appear normal. Mood & affect appropriate.  Data Reviewed: I have personally reviewed following labs and imaging studies  CBC: Recent Labs  Lab 02/28/21 0052 02/28/21 0557  WBC 12.0* 13.4*  HGB 15.5 14.3  HCT 45.8 40.8  MCV 89.1 87.2  PLT 244 193   Basic Metabolic Panel: Recent Labs  Lab 02/28/21 0052 02/28/21 0557 03/01/21 0901  NA 137 134* 135  K 3.2* 3.7 4.4  CL 104 102 99  CO2 24 22 26   GLUCOSE 136* 136* 112*  BUN 19 14 13   CREATININE 1.24 0.96 1.18  CALCIUM 8.8* 8.4* 9.1  MG  --   2.1 2.1   GFR: Estimated Creatinine Clearance: 66.3 mL/min (by C-G formula based on SCr of 1.18 mg/dL). Liver Function Tests: No results for input(s): AST, ALT, ALKPHOS, BILITOT, PROT, ALBUMIN in the last 168 hours. No results for input(s): LIPASE, AMYLASE in the last 168 hours. No results for input(s): AMMONIA in the last 168 hours. Coagulation Profile: No results for input(s): INR, PROTIME in the last 168 hours. Cardiac Enzymes: No results for input(s): CKTOTAL, CKMB, CKMBINDEX, TROPONINI in the last 168 hours. BNP (last 3 results) No results for input(s): PROBNP in the last 8760 hours. HbA1C: No results for input(s): HGBA1C in the last 72 hours. CBG: Recent Labs  Lab 02/28/21 0419  GLUCAP 137*   Lipid Profile: No results for input(s): CHOL, HDL, LDLCALC, TRIG, CHOLHDL, LDLDIRECT in the last 72 hours. Thyroid Function Tests: No results for input(s): TSH, T4TOTAL, FREET4, T3FREE, THYROIDAB in the last 72 hours. Anemia Panel: No results for input(s): VITAMINB12, FOLATE, FERRITIN, TIBC, IRON, RETICCTPCT in the last 72 hours. Sepsis Labs: No results for input(s): PROCALCITON, LATICACIDVEN in the last 168 hours.  Recent Results (from the past 240 hour(s))  Resp Panel by RT-PCR (Flu A&B, Covid) Nasopharyngeal Swab     Status: None   Collection Time: 02/28/21 12:52 AM   Specimen: Nasopharyngeal Swab; Nasopharyngeal(NP) swabs in vial transport medium  Result Value Ref Range Status   SARS Coronavirus 2 by RT PCR NEGATIVE NEGATIVE Final    Comment: (NOTE) SARS-CoV-2 target nucleic acids are NOT DETECTED.  The SARS-CoV-2 RNA is generally detectable in upper respiratory specimens during the acute phase of infection. The lowest concentration of SARS-CoV-2 viral copies this assay can detect is 138 copies/mL. A negative result does not preclude SARS-Cov-2 infection and should not be used as the sole basis for treatment or other patient management decisions. A negative result may occur  with  improper specimen collection/handling, submission of specimen other than nasopharyngeal swab, presence of viral mutation(s) within the areas targeted by this assay, and inadequate number of viral copies(<138 copies/mL). A negative result must be combined with clinical observations, patient history, and epidemiological information. The expected result is Negative.  Fact Sheet for Patients:  BloggerCourse.com  Fact Sheet for Healthcare Providers:  SeriousBroker.it  This test is no t yet approved or cleared by the [place] FDA and  has been authorized for detection and/or diagnosis of SARS-CoV-2 by FDA under an Emergency Use Authorization (EUA). This EUA will remain  in effect (meaning this test can be used) for the duration of the COVID-19 declaration under Section 564(b)(1) of the Act, 21 U.S.C.section 360bbb-3(b)(1), unless the authorization is terminated  or revoked sooner.       Influenza A by PCR NEGATIVE NEGATIVE Final   Influenza B by PCR NEGATIVE NEGATIVE Final    Comment: (NOTE) The Xpert Xpress SARS-CoV-2/FLU/RSV plus assay is intended as an aid in the diagnosis of influenza  from Nasopharyngeal swab specimens and should not be used as a sole basis for treatment. Nasal washings and aspirates are unacceptable for Xpert Xpress SARS-CoV-2/FLU/RSV testing.  Fact Sheet for Patients: BloggerCourse.com  Fact Sheet for Healthcare Providers: SeriousBroker.it  This test is not yet approved or cleared by the [place] FDA and has been authorized for detection and/or diagnosis of SARS-CoV-2 by FDA under an Emergency Use Authorization (EUA). This EUA will remain in effect (meaning this test can be used) for the duration of the COVID-19 declaration under Section 564(b)(1) of the Act, 21 U.S.C. section 360bbb-3(b)(1), unless the authorization is terminated  or revoked.  Performed at [facility], [address] [place], [place] [address]   MRSA Next Gen by PCR, Nasal     Status: None   Collection Time: 02/28/21  4:45 AM   Specimen: Nasal Mucosa; Nasal Swab  Result Value Ref Range Status   MRSA by PCR Next Gen NOT DETECTED NOT DETECTED Final    Comment: (NOTE) The GeneXpert MRSA Assay (FDA approved for NASAL specimens only), is one component of a comprehensive MRSA colonization surveillance program. It is not intended to diagnose MRSA infection nor to guide or monitor treatment for MRSA infections. Test performance is not FDA approved in patients less than 1 years old. Performed at [facility], [address] [place], [place] [address]          Radiology Studies: CARDIAC CATHETERIZATION  Result Date: 02/28/2021   Prox RCA lesion is 95% stenosed.   Prox RCA to Mid RCA lesion is 30% stenosed.   RPDA lesion is 50% stenosed.   Prox LAD to Mid LAD lesion is 95% stenosed.   Prox LAD lesion is 95% stenosed.   Prox Cx lesion is 50% stenosed.   Mid LAD lesion is 30% stenosed.   Dist LAD lesion is 30% stenosed.   A drug-eluting stent was successfully placed using a STENT ONYX FRONTIER 2.75X22.   A drug-eluting stent was successfully placed using a STENT ONYX FRONTIER 3.0X22.   Post intervention, there is a 0% residual stenosis.   Post intervention, there is a 0% residual stenosis.   There is severe left ventricular systolic dysfunction.   The left ventricular ejection fraction is less than 25% by visual estimate. 1.  Anterior ST elevation myocardial infarction 2.  95% complex, heavily calcified proximal/mid LAD stenosis likely culprit lesion 3.  95% stenosis proximal RCA 4.  Dilated cardiomyopathy with severely reduced left ventricular function 5.  Successful primary PCI with overlapping DES proximal and mid LAD Recommendations 1.  Dual antiplatelet therapy uninterrupted for 1 year 2.  Start high intensity atorvastatin 80 mg  daily 3.  Start metoprolol tartrate 25 mg twice daily 4.  2D echocardiogram 5.  Stage PCI RCA for 03/03/2021   [facility] Chest Port 1 View  Result Date: 02/28/2021 CLINICAL DATA:  Shortness of breath and chest pain. EXAM: PORTABLE CHEST 1 VIEW COMPARISON:  Chest radiograph dated 04/08/2018. FINDINGS: No focal consolidation, pleural effusion or pneumothorax. The cardiac silhouette is within limits no acute osseous pathology. IMPRESSION: No active disease. Electronically Signed   By: Elgie Collard M.D.   On: 02/28/2021 01:10   ECHOCARDIOGRAM COMPLETE  Result Date: 02/28/2021    ECHOCARDIOGRAM REPORT   Patient Name:   Robert Berry Date of Exam: 02/28/2021 Medical Rec #:  [id number]          Height:       68.0 in Accession #:  [id number]         Weight:       146.2 lb Date of Birth:  Dec 30, 1965           BSA:          1.789 m Patient Age:    55 years           BP:           108/90 mmHg Patient Gender: M                  HR:           85 bpm. Exam Location:  [facility] Procedure: 2D Echo, Cardiac Doppler, Color Doppler and Strain Analysis Indications:     Acute myocardial infarction--Unspecified I21.9  History:         Patient has no prior history of Echocardiogram examinations.                  Risk Factors:Hypertension.  Sonographer:     Cristela Blue Referring Phys:  [id number] Lyn Hollingshead PARASCHOS Diagnosing Phys: Sena Slate  Sonographer Comments: Global longitudinal strain was attempted. IMPRESSIONS  1. Left ventricular ejection fraction, by estimation, is 25 to 30%. The left ventricle has severely decreased function. The left ventricle demonstrates regional wall motion abnormalities (see scoring diagram/findings for description). Left ventricular diastolic parameters are consistent with Grade I diastolic dysfunction (impaired relaxation).  2. Right ventricular systolic function is normal. The right ventricular size is normal.  3. The mitral valve is normal in structure. Mild mitral valve regurgitation.  4. The aortic  valve is normal in structure. Aortic valve regurgitation is not visualized. No aortic stenosis is present.  5. Aortic dilatation noted. There is mild dilatation of the ascending aorta, measuring 38 mm. FINDINGS  Left Ventricle: Left ventricular ejection fraction, by estimation, is 25 to 30%. The left ventricle has severely decreased function. The left ventricle demonstrates regional wall motion abnormalities. The left ventricular internal cavity size was normal  in size. There is no left ventricular hypertrophy. Left ventricular diastolic parameters are consistent with Grade I diastolic dysfunction (impaired relaxation).  LV Wall Scoring: The mid and distal anterior wall, mid and distal anterior septum, mid inferoseptal segment, and apex are akinetic. The apical lateral segment is normal. Right Ventricle: The right ventricular size is normal. No increase in right ventricular wall thickness. Right ventricular systolic function is normal. Left Atrium: Left atrial size was normal in size. Right Atrium: Right atrial size was normal in size. Pericardium: There is no evidence of pericardial effusion. Mitral Valve: The mitral valve is normal in structure. Mild mitral valve regurgitation. Tricuspid Valve: The tricuspid valve is normal in structure. Tricuspid valve regurgitation is trivial. Aortic Valve: The aortic valve is normal in structure. Aortic valve regurgitation is not visualized. No aortic stenosis is present. Aortic valve mean gradient measures 2.0 mmHg. Aortic valve peak gradient measures 3.7 mmHg. Aortic valve area, by VTI measures 2.50 cm. Pulmonic Valve: The pulmonic valve was not well visualized. Pulmonic valve regurgitation is not visualized. Aorta: Aortic dilatation noted. There is mild dilatation of the ascending aorta, measuring 38 mm. IAS/Shunts: The interatrial septum was not assessed.  LEFT VENTRICLE PLAX 2D LVIDd:         4.90 cm      Diastology LVIDs:         4.10 cm      LV e' medial:    5.22 cm/s  LV PW:  0.90 cm      LV E/e' medial:  13.1 LV IVS:        0.75 cm      LV e' lateral:   7.83 cm/s LVOT diam:     2.00 cm      LV E/e' lateral: 8.8 LV SV:         39 LV SV Index:   22 LVOT Area:     3.14 cm  LV Volumes (MOD) LV vol d, MOD A2C: 98.7 ml LV vol d, MOD A4C: 106.0 ml LV vol s, MOD A2C: 56.9 ml LV vol s, MOD A4C: 66.7 ml LV SV MOD A2C:     41.8 ml LV SV MOD A4C:     106.0 ml LV SV MOD BP:      42.1 ml RIGHT VENTRICLE RV Basal diam:  2.50 cm RV S prime:     10.90 cm/s TAPSE (M-mode): 3.8 cm LEFT ATRIUM           Index       RIGHT ATRIUM           Index LA diam:      4.00 cm 2.24 cm/m  RA Area:     10.60 cm LA Vol (A2C): 56.8 ml 31.73 ml/m RA Volume:   22.70 ml  12.69 ml/m LA Vol (A4C): 18.9 ml 10.57 ml/m  AORTIC VALVE                   PULMONIC VALVE AV Area (Vmax):    2.32 cm    PV Vmax:        1.10 m/s AV Area (Vmean):   2.28 cm    PV Peak grad:   4.8 mmHg AV Area (VTI):     2.50 cm    RVOT Peak grad: 3 mmHg AV Vmax:           96.10 cm/s AV Vmean:          65.300 cm/s AV VTI:            0.157 m AV Peak Grad:      3.7 mmHg AV Mean Grad:      2.0 mmHg LVOT Vmax:         71.10 cm/s LVOT Vmean:        47.400 cm/s LVOT VTI:          0.125 m LVOT/AV VTI ratio: 0.80  AORTA Ao Root diam: 3.43 cm MITRAL VALVE               TRICUSPID VALVE MV Area (PHT): 5.16 cm    TR Peak grad:   23.0 mmHg MV Decel Time: 147 msec    TR Vmax:        240.00 cm/s MV E velocity: 68.60 cm/s MV A velocity: 50.60 cm/s  SHUNTS MV E/A ratio:  1.36        Systemic VTI:  0.12 m                            Systemic Diam: 2.00 cm Sena Slate Electronically signed by Sena Slate Signature Date/Time: 02/28/2021/9:09:38 AM    Final         Scheduled Meds:  aspirin  81 mg Oral Daily   atorvastatin  80 mg Oral Daily   Chlorhexidine Gluconate Cloth  6 each Topical Daily   losartan  12.5 mg Oral Daily   melatonin  5 mg  Oral QHS   metoprolol tartrate  25 mg Oral BID   sodium chloride flush  3 mL Intravenous Q12H   sodium  chloride flush  3 mL Intravenous Q12H   ticagrelor  90 mg Oral BID   Continuous Infusions:  sodium chloride       LOS: 1 day    Time spent: 25 min    Tresa Moore, MD [facility] Pager [phone]  If 7PM-7AM, please contact night-coverage 03/01/2021, 11:54 AM

## 2021-03-02 LAB — BASIC METABOLIC PANEL
Anion gap: 10 (ref 5–15)
BUN: 18 mg/dL (ref 6–20)
CO2: 26 mmol/L (ref 22–32)
Calcium: 9.1 mg/dL (ref 8.9–10.3)
Chloride: 99 mmol/L (ref 98–111)
Creatinine, Ser: 1.26 mg/dL — ABNORMAL HIGH (ref 0.61–1.24)
GFR, Estimated: >60 mL/min (ref >60)
Glucose, Bld: 108 mg/dL — ABNORMAL HIGH (ref 70–99)
Potassium: 4.5 mmol/L (ref 3.5–5.1)
Sodium: 135 mmol/L (ref 135–145)

## 2021-03-02 LAB — BRAIN NATRIURETIC PEPTIDE: B Natriuretic Peptide: 166.8 pg/mL — ABNORMAL HIGH (ref 0.0–100.0)

## 2021-03-02 LAB — MAGNESIUM: Magnesium: 2 mg/dL (ref 1.7–2.4)

## 2021-03-02 MED ORDER — SODIUM CHLORIDE 0.9% FLUSH
3.0000 mL | INTRAVENOUS | Status: DC | PRN
  Administered 2021-03-02: 3 mL via INTRAVENOUS

## 2021-03-02 MED ORDER — SODIUM CHLORIDE 0.9 % IV SOLN: INTRAVENOUS | Status: DC

## 2021-03-02 MED ORDER — SODIUM CHLORIDE 0.9 % IV SOLN: 250.0000 mL | INTRAVENOUS | Status: DC | PRN

## 2021-03-02 NOTE — Progress Notes (Signed)
[facility] Cardiology  CARDIOLOGY CONSULT NOTE  Patient ID: Robert Berry MRN: [id number] DOB/AGE: 1966/01/01 55 y.o.  Admit date: 02/28/2021 Referring Physician [facility] Primary Physician  Primary Cardiologist  Reason for Consultation anterior ST elevation myocardial infarction  HPI: 55 year old gentleman referred for evaluation of anterior ST elevation myocardial infarction.  His usual state of health until 3:30 in the afternoon on 02/27/2021 when he developed substernal chest tightness associated with diaphoresis and nausea. S/p PCI to LAD (heavily calcified, two overlapping DES), with residual disease in RCA. EF 25% with anteroapical akinesis.   Interval history: - Feels good this morning but is anxious about procedure tomorrow.  - Walking last night developed SVT with rate of 180 that self resolved within a few minutes. No symptoms aside from palpitations.  - No chest pain or shortness of breath. Orthopnea from the night before has resolved.   Review of systems complete and found to be negative unless listed above     Past Medical History:  Diagnosis Date   Hypertension     Past Surgical History:  Procedure Laterality Date   CORONARY/GRAFT ACUTE MI REVASCULARIZATION N/A 02/28/2021   Procedure: Coronary/Graft Acute MI Revascularization;  Surgeon: Marcina Millard, MD;  Location: [facility] INVASIVE CV LAB;  Service: Cardiovascular;  Laterality: N/A;   facial construction L side Left    LEFT HEART CATH AND CORONARY ANGIOGRAPHY N/A 02/28/2021   Procedure: LEFT HEART CATH AND CORONARY ANGIOGRAPHY;  Surgeon: Marcina Millard, MD;  Location: [facility] INVASIVE CV LAB;  Service: Cardiovascular;  Laterality: N/A;    No medications prior to admission.    Social History   Socioeconomic History   Marital status: Married    Spouse name: Not on file   Number of children: Not on file   Years of education: Not on file   Highest education level: Not on file  Occupational History   Not on  file  Tobacco Use   Smoking status: Never   Smokeless tobacco: Never  Substance and Sexual Activity   Alcohol use: No   Drug use: No   Sexual activity: Not on file  Other Topics Concern   Not on file  Social History Narrative   Not on file   Social Determinants of Health   Financial Resource Strain: Not on file  Food Insecurity: Not on file  Transportation Needs: Not on file  Physical Activity: Not on file  Stress: Not on file  Social Connections: Not on file  Intimate Partner Violence: Not on file    No family history on file.    Review of systems complete and found to be negative unless listed above      PHYSICAL EXAM  General: Well developed, well nourished, in no acute distress HEENT:  Normocephalic and atramatic Neck:  No JVD.  Lungs: Clear bilaterally to auscultation and percussion. Heart: HRRR . Normal S1 and S2 without gallops or murmurs.  Abdomen: Bowel sounds are positive, abdomen soft and non-tender  Msk:  Back normal, normal gait. Normal strength and tone for age. Extremities: No clubbing, cyanosis or edema.   Neuro: Alert and oriented X 3. Psych:  Good affect, responds appropriately  Labs:   Lab Results  Component Value Date   WBC 13.4 (H) 02/28/2021   HGB 14.3 02/28/2021   HCT 40.8 02/28/2021   MCV 87.2 02/28/2021   PLT 193 02/28/2021    Recent Labs  Lab 03/02/21 0625  NA 135  K 4.5  CL 99  CO2 26  BUN 18  CREATININE 1.26*  CALCIUM 9.1  GLUCOSE 108*    No results found for: CKTOTAL, CKMB, CKMBINDEX, TROPONINI No results found for: CHOL No results found for: HDL No results found for: LDLCALC No results found for: TRIG No results found for: CHOLHDL No results found for: LDLDIRECT    Radiology: CARDIAC CATHETERIZATION  Result Date: 02/28/2021   Prox RCA lesion is 95% stenosed.   Prox RCA to Mid RCA lesion is 30% stenosed.   RPDA lesion is 50% stenosed.   Prox LAD to Mid LAD lesion is 95% stenosed.   Prox LAD lesion is 95%  stenosed.   Prox Cx lesion is 50% stenosed.   Mid LAD lesion is 30% stenosed.   Dist LAD lesion is 30% stenosed.   A drug-eluting stent was successfully placed using a STENT ONYX FRONTIER 2.75X22.   A drug-eluting stent was successfully placed using a STENT ONYX FRONTIER 3.0X22.   Post intervention, there is a 0% residual stenosis.   Post intervention, there is a 0% residual stenosis.   There is severe left ventricular systolic dysfunction.   The left ventricular ejection fraction is less than 25% by visual estimate. 1.  Anterior ST elevation myocardial infarction 2.  95% complex, heavily calcified proximal/mid LAD stenosis likely culprit lesion 3.  95% stenosis proximal RCA 4.  Dilated cardiomyopathy with severely reduced left ventricular function 5.  Successful primary PCI with overlapping DES proximal and mid LAD Recommendations 1.  Dual antiplatelet therapy uninterrupted for 1 year 2.  Start high intensity atorvastatin 80 mg daily 3.  Start metoprolol tartrate 25 mg twice daily 4.  2D echocardiogram 5.  Stage PCI RCA for 03/03/2021   [facility] Chest Port 1 View  Result Date: 02/28/2021 CLINICAL DATA:  Shortness of breath and chest pain. EXAM: PORTABLE CHEST 1 VIEW COMPARISON:  Chest radiograph dated 04/08/2018. FINDINGS: No focal consolidation, pleural effusion or pneumothorax. The cardiac silhouette is within limits no acute osseous pathology. IMPRESSION: No active disease. Electronically Signed   By: Elgie Collard M.D.   On: 02/28/2021 01:10   ECHOCARDIOGRAM COMPLETE  Result Date: 02/28/2021    ECHOCARDIOGRAM REPORT   Patient Name:   Robert Berry Date of Exam: 02/28/2021 Medical Rec #:  [id number]          Height:       68.0 in Accession #:    [id number]         Weight:       146.2 lb Date of Birth:  11-03-1965           BSA:          1.789 m Patient Age:    55 years           BP:           108/90 mmHg Patient Gender: M                  HR:           85 bpm. Exam Location:  [facility] Procedure: 2D Echo,  Cardiac Doppler, Color Doppler and Strain Analysis Indications:     Acute myocardial infarction--Unspecified I21.9  History:         Patient has no prior history of Echocardiogram examinations.                  Risk Factors:Hypertension.  Sonographer:     Cristela Blue Referring Phys:  [id number] Lyn Hollingshead PARASCHOS Diagnosing Phys: Sena Slate  Sonographer Comments: Global longitudinal strain  was attempted. IMPRESSIONS  1. Left ventricular ejection fraction, by estimation, is 25 to 30%. The left ventricle has severely decreased function. The left ventricle demonstrates regional wall motion abnormalities (see scoring diagram/findings for description). Left ventricular diastolic parameters are consistent with Grade I diastolic dysfunction (impaired relaxation).  2. Right ventricular systolic function is normal. The right ventricular size is normal.  3. The mitral valve is normal in structure. Mild mitral valve regurgitation.  4. The aortic valve is normal in structure. Aortic valve regurgitation is not visualized. No aortic stenosis is present.  5. Aortic dilatation noted. There is mild dilatation of the ascending aorta, measuring 38 mm. FINDINGS  Left Ventricle: Left ventricular ejection fraction, by estimation, is 25 to 30%. The left ventricle has severely decreased function. The left ventricle demonstrates regional wall motion abnormalities. The left ventricular internal cavity size was normal  in size. There is no left ventricular hypertrophy. Left ventricular diastolic parameters are consistent with Grade I diastolic dysfunction (impaired relaxation).  LV Wall Scoring: The mid and distal anterior wall, mid and distal anterior septum, mid inferoseptal segment, and apex are akinetic. The apical lateral segment is normal. Right Ventricle: The right ventricular size is normal. No increase in right ventricular wall thickness. Right ventricular systolic function is normal. Left Atrium: Left atrial size was normal in size.  Right Atrium: Right atrial size was normal in size. Pericardium: There is no evidence of pericardial effusion. Mitral Valve: The mitral valve is normal in structure. Mild mitral valve regurgitation. Tricuspid Valve: The tricuspid valve is normal in structure. Tricuspid valve regurgitation is trivial. Aortic Valve: The aortic valve is normal in structure. Aortic valve regurgitation is not visualized. No aortic stenosis is present. Aortic valve mean gradient measures 2.0 mmHg. Aortic valve peak gradient measures 3.7 mmHg. Aortic valve area, by VTI measures 2.50 cm. Pulmonic Valve: The pulmonic valve was not well visualized. Pulmonic valve regurgitation is not visualized. Aorta: Aortic dilatation noted. There is mild dilatation of the ascending aorta, measuring 38 mm. IAS/Shunts: The interatrial septum was not assessed.  LEFT VENTRICLE PLAX 2D LVIDd:         4.90 cm      Diastology LVIDs:         4.10 cm      LV e' medial:    5.22 cm/s LV PW:         0.90 cm      LV E/e' medial:  13.1 LV IVS:        0.75 cm      LV e' lateral:   7.83 cm/s LVOT diam:     2.00 cm      LV E/e' lateral: 8.8 LV SV:         39 LV SV Index:   22 LVOT Area:     3.14 cm  LV Volumes (MOD) LV vol d, MOD A2C: 98.7 ml LV vol d, MOD A4C: 106.0 ml LV vol s, MOD A2C: 56.9 ml LV vol s, MOD A4C: 66.7 ml LV SV MOD A2C:     41.8 ml LV SV MOD A4C:     106.0 ml LV SV MOD BP:      42.1 ml RIGHT VENTRICLE RV Basal diam:  2.50 cm RV S prime:     10.90 cm/s TAPSE (M-mode): 3.8 cm LEFT ATRIUM           Index       RIGHT ATRIUM  Index LA diam:      4.00 cm 2.24 cm/m  RA Area:     10.60 cm LA Vol (A2C): 56.8 ml 31.73 ml/m RA Volume:   22.70 ml  12.69 ml/m LA Vol (A4C): 18.9 ml 10.57 ml/m  AORTIC VALVE                   PULMONIC VALVE AV Area (Vmax):    2.32 cm    PV Vmax:        1.10 m/s AV Area (Vmean):   2.28 cm    PV Peak grad:   4.8 mmHg AV Area (VTI):     2.50 cm    RVOT Peak grad: 3 mmHg AV Vmax:           96.10 cm/s AV Vmean:           65.300 cm/s AV VTI:            0.157 m AV Peak Grad:      3.7 mmHg AV Mean Grad:      2.0 mmHg LVOT Vmax:         71.10 cm/s LVOT Vmean:        47.400 cm/s LVOT VTI:          0.125 m LVOT/AV VTI ratio: 0.80  AORTA Ao Root diam: 3.43 cm MITRAL VALVE               TRICUSPID VALVE MV Area (PHT): 5.16 cm    TR Peak grad:   23.0 mmHg MV Decel Time: 147 msec    TR Vmax:        240.00 cm/s MV E velocity: 68.60 cm/s MV A velocity: 50.60 cm/s  SHUNTS MV E/A ratio:  1.36        Systemic VTI:  0.12 m                            Systemic Diam: 2.00 cm Sena Slate Electronically signed by Sena Slate Signature Date/Time: 02/28/2021/9:09:38 AM    Final     EKG: Sinus rhythm with ST elevation in leads V2 through V5  ASSESSMENT AND PLAN:   1.  Anterior ST elevation myocardial infarction, with new onset chest pain, ST elevation leads V2 through V5- s/p DES x 2 to mid LAD 2. CAD with residual high grade stenosis in proximal RCA 3. Ischemic cardiomyopathy with EF 25% with anterior akinesis 4.  Essential hypertension, blood pressure elevated 5. SVT on 9/24, self resolving.  Recommendations  - Continue aspirin and ticagrelor 90 mg BID for 12 months--> Patient does not have insurance. May need to switch to plavix prior to discharge. - Continue lipitor 80 mg daily - Continue metoprlol 25 mg BID - Losartan 12.5 mg daily - Plan for PCI to proximal RCA Monday. NPO at midnight Monday morning.  - please check daily renal function while inpatient; CBC Monday morning. Ordered. - Discussed lifevest at discharge. Will need to investigate Monday/Tuesday, though patient does not have insurance.    Signed: Armando Reichert MD 03/02/2021, 8:49 AM

## 2021-03-02 NOTE — Progress Notes (Signed)
PROGRESS NOTE    Robert Berry  JAS:[id number] DOB: 1966/05/13 DOA: 02/28/2021 PCP: Patient, No Pcp Per (Inactive)    Brief Narrative:  55 year old male with medical history of dyslipidemia and essential hypertension.  Strong family history of coronary artery disease who presented to the ED with acute onset of intermittent chest pain.  Presentation on arrival demonstrated marked hypertension and tachypnea.  High-sensitivity troponin initially 182.  EKG with anteroseptal STEMI with ST elevation in V2 through V4 with reciprocal T wave inversions.  Code STEMI activated.  Patient taken emergently to Cath Lab.  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  Also had dilated cardiomyopathy with EF 20%.  Underwent catheterization and PCI with DES deployed to LAD.   Per cardiology plan for return to Cath Lab on Monday 9/26 for two-stage intervention.  Patient is hemodynamically stable and chest pain-free at time of my evaluation.   Assessment & Plan:   Active Problems:   STEMI (ST elevation myocardial infarction) (HCC)  Acute STEMI Multivessel coronary artery disease Ischemic cardiomyopathy EF 25% Essential hypertension Patient with strong family history of early cardiac death Plan: Continue DAPT, currently on aspirin and ticagrelor Patient uninsured, may need switch to Plavix prior to discharge High intensity statin Metoprolol 25 twice daily Losartan 12.5 mg daily N.p.o. after midnight Cath Lab tomorrow Will need to reevaluate for LifeVest   DVT prophylaxis: SCD Code Status: Full Family Communication: None today Disposition Plan: Status is: Inpatient  Remains inpatient appropriate because:Inpatient level of care appropriate due to severity of illness  Dispo: The patient is from: Home              Anticipated d/c is to: Home              Patient currently is not medically stable to d/c.   Difficult to place patient No       Level of care:  Progressive Cardiac  Consultants:  Cardiology  Procedures:  Cardiac catheterization with PCI 9/22  Antimicrobials:  None   Subjective: Patient seen and examined.  Ambulating around hallway.  Feels well.  No distress, no chest pain  Objective: Vitals:   03/01/21 1619 03/01/21 1939 03/01/21 2359 03/02/21 0508  BP: (!) 121/94 130/88 91/63 100/72  Pulse: (!) 103 77 83 81  Resp: 20 17 14 17   Temp: 98.3 F (36.8 C) 98.9 F (37.2 C) 98.3 F (36.8 C) (!) 97.4 F (36.3 C)  TempSrc:  Oral Oral   SpO2: 100% 99% 98% 95%  Weight:      Height:        Intake/Output Summary (Last 24 hours) at 03/02/2021 1153 Last data filed at 03/02/2021 0950 Gross per 24 hour  Intake 1200 ml  Output 1550 ml  Net -350 ml   Filed Weights   02/28/21 0051 02/28/21 0427  Weight: 68.5 kg 66.3 kg    Examination:  General exam: Appears calm and comfortable  Respiratory system: Clear to auscultation. Respiratory effort normal. Cardiovascular system: S1 & S2 heard, RRR. No JVD, murmurs, rubs, gallops or clicks. No pedal edema. Gastrointestinal system: Abdomen is nondistended, soft and nontender. No organomegaly or masses felt. Normal bowel sounds heard. Central nervous system: Alert and oriented. No focal neurological deficits. Extremities: Symmetric 5 x 5 power. Skin: No rashes, lesions or ulcers Psychiatry: Judgement and insight appear normal. Mood & affect appropriate.     Data Reviewed: I have personally reviewed following labs and imaging studies  CBC: Recent  Labs  Lab 02/28/21 0052 02/28/21 0557  WBC 12.0* 13.4*  HGB 15.5 14.3  HCT 45.8 40.8  MCV 89.1 87.2  PLT 244 193   Basic Metabolic Panel: Recent Labs  Lab 02/28/21 0052 02/28/21 0557 03/01/21 0901 03/02/21 0625  NA 137 134* 135 135  K 3.2* 3.7 4.4 4.5  CL 104 102 99 99  CO2 24 22 26 26   GLUCOSE 136* 136* 112* 108*  BUN 19 14 13 18   CREATININE 1.24 0.96 1.18 1.26*  CALCIUM 8.8* 8.4* 9.1 9.1  MG  --  2.1 2.1 2.0    GFR: Estimated Creatinine Clearance: 62.1 mL/min (A) (by C-G formula based on SCr of 1.26 mg/dL (H)). Liver Function Tests: No results for input(s): AST, ALT, ALKPHOS, BILITOT, PROT, ALBUMIN in the last 168 hours. No results for input(s): LIPASE, AMYLASE in the last 168 hours. No results for input(s): AMMONIA in the last 168 hours. Coagulation Profile: No results for input(s): INR, PROTIME in the last 168 hours. Cardiac Enzymes: No results for input(s): CKTOTAL, CKMB, CKMBINDEX, TROPONINI in the last 168 hours. BNP (last 3 results) No results for input(s): PROBNP in the last 8760 hours. HbA1C: No results for input(s): HGBA1C in the last 72 hours. CBG: Recent Labs  Lab 02/28/21 0419  GLUCAP 137*   Lipid Profile: No results for input(s): CHOL, HDL, LDLCALC, TRIG, CHOLHDL, LDLDIRECT in the last 72 hours. Thyroid Function Tests: No results for input(s): TSH, T4TOTAL, FREET4, T3FREE, THYROIDAB in the last 72 hours. Anemia Panel: No results for input(s): VITAMINB12, FOLATE, FERRITIN, TIBC, IRON, RETICCTPCT in the last 72 hours. Sepsis Labs: No results for input(s): PROCALCITON, LATICACIDVEN in the last 168 hours.  Recent Results (from the past 240 hour(s))  Resp Panel by RT-PCR (Flu A&B, Covid) Nasopharyngeal Swab     Status: None   Collection Time: 02/28/21 12:52 AM   Specimen: Nasopharyngeal Swab; Nasopharyngeal(NP) swabs in vial transport medium  Result Value Ref Range Status   SARS Coronavirus 2 by RT PCR NEGATIVE NEGATIVE Final    Comment: (NOTE) SARS-CoV-2 target nucleic acids are NOT DETECTED.  The SARS-CoV-2 RNA is generally detectable in upper respiratory specimens during the acute phase of infection. The lowest concentration of SARS-CoV-2 viral copies this assay can detect is 138 copies/mL. A negative result does not preclude SARS-Cov-2 infection and should not be used as the sole basis for treatment or other patient management decisions. A negative result may occur  with  improper specimen collection/handling, submission of specimen other than nasopharyngeal swab, presence of viral mutation(s) within the areas targeted by this assay, and inadequate number of viral copies(<138 copies/mL). A negative result must be combined with clinical observations, patient history, and epidemiological information. The expected result is Negative.  Fact Sheet for Patients:  03/02/21  Fact Sheet for Healthcare Providers:  03/02/21  This test is no t yet approved or cleared by the [employer] FDA and  has been authorized for detection and/or diagnosis of SARS-CoV-2 by FDA under an Emergency Use Authorization (EUA). This EUA will remain  in effect (meaning this test can be used) for the duration of the COVID-19 declaration under Section 564(b)(1) of the Act, 21 U.S.C.section 360bbb-3(b)(1), unless the authorization is terminated  or revoked sooner.       Influenza A by PCR NEGATIVE NEGATIVE Final   Influenza B by PCR NEGATIVE NEGATIVE Final    Comment: (NOTE) The Xpert Xpress SARS-CoV-2/FLU/RSV plus assay is intended as an aid in the diagnosis of influenza from  Nasopharyngeal swab specimens and should not be used as a sole basis for treatment. Nasal washings and aspirates are unacceptable for Xpert Xpress SARS-CoV-2/FLU/RSV testing.  Fact Sheet for Patients: [employer]  Fact Sheet for Healthcare Providers: [employer]it  This test is not yet approved or cleared by the [place] FDA and has been authorized for detection and/or diagnosis of SARS-CoV-2 by FDA under an Emergency Use Authorization (EUA). This EUA will remain in effect (meaning this test can be used) for the duration of the COVID-19 declaration under Section 564(b)(1) of the Act, 21 U.S.C. section 360bbb-3(b)(1), unless the authorization is terminated  or revoked.  Performed at [facility], [address] [place], [place] [address]   MRSA Next Gen by PCR, Nasal     Status: None   Collection Time: 02/28/21  4:45 AM   Specimen: Nasal Mucosa; Nasal Swab  Result Value Ref Range Status   MRSA by PCR Next Gen NOT DETECTED NOT DETECTED Final    Comment: (NOTE) The GeneXpert MRSA Assay (FDA approved for NASAL specimens only), is one component of a comprehensive MRSA colonization surveillance program. It is not intended to diagnose MRSA infection nor to guide or monitor treatment for MRSA infections. Test performance is not FDA approved in patients less than 30 years old. Performed at [facility], [address], [place], [place] [address]          Radiology Studies: No results found.      Scheduled Meds:  aspirin  81 mg Oral Daily   atorvastatin  80 mg Oral Daily   Chlorhexidine Gluconate Cloth  6 each Topical Daily   enoxaparin (LOVENOX) injection  40 mg Subcutaneous Q24H   losartan  12.5 mg Oral Daily   melatonin  5 mg Oral QHS   metoprolol tartrate  25 mg Oral BID   sodium chloride flush  3 mL Intravenous Q12H   sodium chloride flush  3 mL Intravenous Q12H   ticagrelor  90 mg Oral BID   Continuous Infusions:  sodium chloride       LOS: 2 days    Time spent: 15 min    Tresa Moore, MD [facility] Pager [phone]  If 7PM-7AM, please contact night-coverage 03/02/2021, 11:53 AM

## 2021-03-03 ENCOUNTER — Encounter
Admission: EM | Disposition: A | Discharge status: Home / Self Care | Payer: Self-pay | Source: Home / Self Care | Attending: Internal Medicine

## 2021-03-03 ENCOUNTER — Other Ambulatory Visit: Payer: Self-pay

## 2021-03-03 ENCOUNTER — Encounter: Payer: Self-pay | Admitting: Cardiology

## 2021-03-03 HISTORY — PX: CORONARY STENT INTERVENTION: CATH118234

## 2021-03-03 LAB — CBC
HCT: 46.7 % (ref 39.0–52.0)
Hemoglobin: 16.1 g/dL (ref 13.0–17.0)
MCH: 30.7 pg (ref 26.0–34.0)
MCHC: 34.5 g/dL (ref 30.0–36.0)
MCV: 89 fL (ref 80.0–100.0)
Platelets: 203 10*3/uL (ref 150–400)
RBC: 5.25 MIL/uL (ref 4.22–5.81)
RDW: 13.2 % (ref 11.5–15.5)
WBC: 9.8 10*3/uL (ref 4.0–10.5)
nRBC: 0 % (ref 0.0–0.2)

## 2021-03-03 LAB — POCT ACTIVATED CLOTTING TIME
Activated Clotting Time: 347 seconds
Activated Clotting Time: 231 seconds

## 2021-03-03 LAB — BASIC METABOLIC PANEL
Anion gap: 13 (ref 5–15)
BUN: 18 mg/dL (ref 6–20)
CO2: 24 mmol/L (ref 22–32)
Calcium: 9 mg/dL (ref 8.9–10.3)
Chloride: 99 mmol/L (ref 98–111)
Creatinine, Ser: 1.1 mg/dL (ref 0.61–1.24)
GFR, Estimated: >60 mL/min (ref >60)
Glucose, Bld: 102 mg/dL — ABNORMAL HIGH (ref 70–99)
Potassium: 4 mmol/L (ref 3.5–5.1)
Sodium: 136 mmol/L (ref 135–145)

## 2021-03-03 SURGERY — CORONARY STENT INTERVENTION
Anesthesia: Moderate Sedation

## 2021-03-03 MED ORDER — IOHEXOL 350 MG/ML SOLN
INTRAVENOUS | Status: DC | PRN
  Administered 2021-03-03: 68 mL

## 2021-03-03 MED ORDER — ASPIRIN 81 MG PO CHEW
CHEWABLE_TABLET | ORAL | Status: AC
  Filled 2021-03-03: qty 1

## 2021-03-03 MED ORDER — MIDAZOLAM HCL 2 MG/2ML IJ SOLN
INTRAMUSCULAR | Status: AC
  Filled 2021-03-03: qty 2

## 2021-03-03 MED ORDER — FENTANYL CITRATE (PF) 100 MCG/2ML IJ SOLN
INTRAMUSCULAR | Status: AC
  Filled 2021-03-03: qty 2

## 2021-03-03 MED ORDER — VERAPAMIL HCL 2.5 MG/ML IV SOLN
INTRAVENOUS | Status: AC
  Filled 2021-03-03: qty 2

## 2021-03-03 MED ORDER — HEPARIN SODIUM (PORCINE) 1000 UNIT/ML IJ SOLN
INTRAMUSCULAR | Status: AC
  Filled 2021-03-03: qty 1

## 2021-03-03 MED ORDER — HEPARIN SODIUM (PORCINE) 1000 UNIT/ML IJ SOLN
INTRAMUSCULAR | Status: DC | PRN
  Administered 2021-03-03: 6500 [IU] via INTRAVENOUS
  Administered 2021-03-03: 2000 [IU] via INTRAVENOUS

## 2021-03-03 MED ORDER — HEPARIN (PORCINE) IN NACL 1000-0.9 UT/500ML-% IV SOLN
INTRAVENOUS | Status: DC | PRN
  Administered 2021-03-03: 1000 mL

## 2021-03-03 MED ORDER — VERAPAMIL HCL 2.5 MG/ML IV SOLN
INTRAVENOUS | Status: DC | PRN
  Administered 2021-03-03: 2.5 mg via INTRA_ARTERIAL

## 2021-03-03 MED ORDER — SODIUM CHLORIDE 0.9% FLUSH: 3.0000 mL | INTRAVENOUS | Status: DC | PRN

## 2021-03-03 MED ORDER — HEPARIN (PORCINE) IN NACL 1000-0.9 UT/500ML-% IV SOLN
INTRAVENOUS | Status: AC
  Filled 2021-03-03: qty 1000

## 2021-03-03 MED ORDER — LIDOCAINE HCL (PF) 1 % IJ SOLN
INTRAMUSCULAR | Status: DC | PRN
  Administered 2021-03-03: 2 mL

## 2021-03-03 MED ORDER — SODIUM CHLORIDE 0.9% FLUSH
3.0000 mL | Freq: Two times a day (BID) | INTRAVENOUS | Status: DC
  Administered 2021-03-03 – 2021-03-04 (×3): 3 mL via INTRAVENOUS

## 2021-03-03 MED ORDER — FENTANYL CITRATE (PF) 100 MCG/2ML IJ SOLN
INTRAMUSCULAR | Status: DC | PRN
  Administered 2021-03-03: 50 ug via INTRAVENOUS

## 2021-03-03 MED ORDER — MIDAZOLAM HCL 2 MG/2ML IJ SOLN
INTRAMUSCULAR | Status: DC | PRN
  Administered 2021-03-03: 1 mg via INTRAVENOUS

## 2021-03-03 MED ORDER — SODIUM CHLORIDE 0.9 % IV SOLN: INTRAVENOUS | Status: AC

## 2021-03-03 MED ORDER — TICAGRELOR 90 MG PO TABS
ORAL_TABLET | ORAL | Status: DC | PRN
  Administered 2021-03-03: 90 mg via ORAL

## 2021-03-03 MED ORDER — LIDOCAINE HCL 1 % IJ SOLN
INTRAMUSCULAR | Status: AC
  Filled 2021-03-03: qty 20

## 2021-03-03 MED ORDER — TICAGRELOR 90 MG PO TABS
ORAL_TABLET | ORAL | Status: AC
  Filled 2021-03-03: qty 1

## 2021-03-03 MED ORDER — SODIUM CHLORIDE 0.9 % IV SOLN: 250.0000 mL | INTRAVENOUS | Status: DC | PRN

## 2021-03-03 SURGICAL SUPPLY — 20 items
BALLN TREK RX 2.5X12 (BALLOONS) ×2
BALLN TREK RX 3.0X12 (BALLOONS) ×2
BALLN ~~LOC~~ TREK RX 3.75X12 (BALLOONS) ×2
BALLOON TREK RX 2.5X12 (BALLOONS) ×1 IMPLANT
BALLOON TREK RX 3.0X12 (BALLOONS) ×1 IMPLANT
BALLOON ~~LOC~~ TREK RX 3.75X12 (BALLOONS) ×1 IMPLANT
CATH LAUNCHER 6FR JR4 (CATHETERS) ×2 IMPLANT
DEVICE RAD TR BAND REGULAR (VASCULAR PRODUCTS) ×2 IMPLANT
DRAPE BRACHIAL (DRAPES) ×2 IMPLANT
GLIDESHEATH SLEND SS 6F .021 (SHEATH) ×2 IMPLANT
GUIDEWIRE INQWIRE 1.5J.035X260 (WIRE) ×1 IMPLANT
INQWIRE 1.5J .035X260CM (WIRE) ×2
KIT ENCORE 26 ADVANTAGE (KITS) ×2 IMPLANT
PACK CARDIAC CATH (CUSTOM PROCEDURE TRAY) ×2 IMPLANT
PROTECTION STATION PRESSURIZED (MISCELLANEOUS) ×2
SET ATX SIMPLICITY (MISCELLANEOUS) ×2 IMPLANT
STATION PROTECTION PRESSURIZED (MISCELLANEOUS) ×1 IMPLANT
STENT ONYX FRONTIER 3.5X15 (Permanent Stent) IMPLANT
STENT ONYX FRONTIER 3.5X18 (Permanent Stent) ×2 IMPLANT
WIRE ASAHI PROWATER 180CM (WIRE) ×2 IMPLANT

## 2021-03-03 NOTE — Plan of Care (Signed)
  Problem: Education: Goal: Knowledge of General Education information will improve Description: Including pain rating scale, medication(s)/side effects and non-pharmacologic comfort measures Outcome: Progressing   Problem: Clinical Measurements: Goal: Cardiovascular complication will be avoided Outcome: Progressing   Problem: Activity: Goal: Risk for activity intolerance will decrease Outcome: Not Progressing  Heart rate up with exertion

## 2021-03-03 NOTE — Interval H&P Note (Signed)
History and Physical Interval Note:  03/03/2021 7:44 AM  Robert Berry  has presented today for surgery, with the diagnosis of STEMI; obstructive disease in non-culprit vessels.  The various methods of treatment have been discussed with the patient and family. After consideration of risks, benefits and other options for treatment, the patient has consented to  Procedure(s): CORONARY STENT INTERVENTION (N/A) as a surgical intervention.  We will intervene on RCA 95% stenosis. The patient's history has been reviewed, patient examined, no change in status, stable for surgery.  I have reviewed the patient's chart and labs.  Questions were answered to the patient's satisfaction.     Jillian Warth MATTHEW Thedford Bunton

## 2021-03-03 NOTE — Progress Notes (Signed)
PROGRESS NOTE    Robert Berry  QKM:[id number] DOB: 12-03-1965 DOA: 03-27-2021 PCP: Patient, No Pcp Per (Inactive)    Brief Narrative:  55 year old male with medical history of dyslipidemia and essential hypertension.  Strong family history of coronary artery disease who presented to the ED with acute onset of intermittent chest pain.  Presentation on arrival demonstrated marked hypertension and tachypnea.  High-sensitivity troponin initially 182.  EKG with anteroseptal STEMI with ST elevation in V2 through V4 with reciprocal T wave inversions.  Code STEMI activated.  Patient taken emergently to Cath Lab.  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  Also had dilated cardiomyopathy with EF 20%.  Underwent catheterization and PCI with DES deployed to LAD.   Discussed with cardiology.  Cath completed 9/26.  Stent to RCA is in place.  Patient evaluated post procedure.  Stable no distress.  Tolerating p.o.  Chest pain and resolved.  Assessment & Plan:   Active Problems:   STEMI (ST elevation myocardial infarction) ([place])  Acute STEMI Multivessel coronary artery disease Ischemic cardiomyopathy EF 25% Essential hypertension Patient with strong family history of early cardiac death STEMI with LAD stent 03/28/23 Return to Cath Lab 9/26, RCA stent placed Plan: Continue DAPT, currently on aspirin and ticagrelor Patient uninsured.  We will attempt to get ticagrelor from medication management pharmacy If unsuccessful will transition to Plavix Continue high intensity statin Metoprolol 25 mg twice daily Losartan 12.5 mg daily Will discuss with case management regarding acquisition of a LifeVest prior to discharge   DVT prophylaxis: SCD Code Status: Full Family Communication: None today Disposition Plan: Status is: Inpatient  Remains inpatient appropriate because:Inpatient level of care appropriate due to severity of illness  Dispo: The patient is from: Home               Anticipated d/c is to: Home              Patient currently is not medically stable to d/c.   Difficult to place patient No       Level of care: Progressive Cardiac  Consultants:  Cardiology  Procedures:  Cardiac catheterization with PCI 9/22 Cardiac cath with PCI 9/26  Antimicrobials:  None   Subjective: Patient seen and examined.  Post catheterization.  Feels well.  Tolerating p.o.  No chest pain  Objective: Vitals:   03/03/21 1110 03/03/21 1130 03/03/21 1200 03/03/21 1237  BP:  (!) 119/94 122/86 118/89  Pulse: (!) 115 (!) 105 90 (!) 101  Resp: (!) $RemoveB'21 16 20 18  'MFLVTLVL$ Temp:    98.4 F (36.9 C)  TempSrc:    Oral  SpO2: 96% 98% 97% 100%  Weight:      Height:        Intake/Output Summary (Last 24 hours) at 03/03/2021 1352 Last data filed at 03/02/2021 1840 Gross per 24 hour  Intake 240 ml  Output --  Net 240 ml   Filed Weights   27-Mar-2021 0051 Mar 27, 2021 0427 03/03/21 0721  Weight: 68.5 kg 66.3 kg 66.3 kg    Examination:  General exam: No acute distress.  Sitting up in bed Respiratory system: Clear to auscultation. Respiratory effort normal. Cardiovascular system: S1-S2, RRR, no murmurs, no pedal edema Gastrointestinal system: Abdomen is nondistended, soft and nontender. No organomegaly or masses felt. Normal bowel sounds heard. Central nervous system: Alert and oriented. No focal neurological deficits. Extremities: Symmetric 5 x 5 power. Skin: No rashes, lesions or ulcers Psychiatry: Judgement and insight  appear normal. Mood & affect appropriate.     Data Reviewed: I have personally reviewed following labs and imaging studies  CBC: Recent Labs  Lab 02/28/21 0052 02/28/21 0557 03/03/21 0631  WBC 12.0* 13.4* 9.8  HGB 15.5 14.3 16.1  HCT 45.8 40.8 46.7  MCV 89.1 87.2 89.0  PLT 244 193 333   Basic Metabolic Panel: Recent Labs  Lab 02/28/21 0052 02/28/21 0557 03/01/21 0901 03/02/21 0625 03/03/21 0631  NA 137 134* 135 135 136  K 3.2* 3.7 4.4  4.5 4.0  CL 104 102 99 99 99  CO2 $Re'24 22 26 26 24  'mnq$ GLUCOSE 136* 136* 112* 108* 102*  BUN $Re'19 14 13 18 18  'YQT$ CREATININE 1.24 0.96 1.18 1.26* 1.10  CALCIUM 8.8* 8.4* 9.1 9.1 9.0  MG  --  2.1 2.1 2.0  --    GFR: Estimated Creatinine Clearance: 71.2 mL/min (by C-G formula based on SCr of 1.1 mg/dL). Liver Function Tests: No results for input(s): AST, ALT, ALKPHOS, BILITOT, PROT, ALBUMIN in the last 168 hours. No results for input(s): LIPASE, AMYLASE in the last 168 hours. No results for input(s): AMMONIA in the last 168 hours. Coagulation Profile: No results for input(s): INR, PROTIME in the last 168 hours. Cardiac Enzymes: No results for input(s): CKTOTAL, CKMB, CKMBINDEX, TROPONINI in the last 168 hours. BNP (last 3 results) No results for input(s): PROBNP in the last 8760 hours. HbA1C: No results for input(s): HGBA1C in the last 72 hours. CBG: Recent Labs  Lab 02/28/21 0419  GLUCAP 137*   Lipid Profile: No results for input(s): CHOL, HDL, LDLCALC, TRIG, CHOLHDL, LDLDIRECT in the last 72 hours. Thyroid Function Tests: No results for input(s): TSH, T4TOTAL, FREET4, T3FREE, THYROIDAB in the last 72 hours. Anemia Panel: No results for input(s): VITAMINB12, FOLATE, FERRITIN, TIBC, IRON, RETICCTPCT in the last 72 hours. Sepsis Labs: No results for input(s): PROCALCITON, LATICACIDVEN in the last 168 hours.  Recent Results (from the past 240 hour(s))  Resp Panel by RT-PCR (Flu A&B, Covid) Nasopharyngeal Swab     Status: None   Collection Time: 02/28/21 12:52 AM   Specimen: Nasopharyngeal Swab; Nasopharyngeal(NP) swabs in vial transport medium  Result Value Ref Range Status   SARS Coronavirus 2 by RT PCR NEGATIVE NEGATIVE Final    Comment: (NOTE) SARS-CoV-2 target nucleic acids are NOT DETECTED.  The SARS-CoV-2 RNA is generally detectable in upper respiratory specimens during the acute phase of infection. The lowest concentration of SARS-CoV-2 viral copies this assay can detect  is 138 copies/mL. A negative result does not preclude SARS-Cov-2 infection and should not be used as the sole basis for treatment or other patient management decisions. A negative result may occur with  improper specimen collection/handling, submission of specimen other than nasopharyngeal swab, presence of viral mutation(s) within the areas targeted by this assay, and inadequate number of viral copies(<138 copies/mL). A negative result must be combined with clinical observations, patient history, and epidemiological information. The expected result is Negative.  Fact Sheet for Patients:  EntrepreneurPulse.com.au  Fact Sheet for Healthcare Providers:  IncredibleEmployment.be  This test is no t yet approved or cleared by the [place] FDA and  has been authorized for detection and/or diagnosis of SARS-CoV-2 by FDA under an Emergency Use Authorization (EUA). This EUA will remain  in effect (meaning this test can be used) for the duration of the COVID-19 declaration under Section 564(b)(1) of the Act, 21 U.S.C.section 360bbb-3(b)(1), unless the authorization is terminated  or revoked sooner.  Influenza A by PCR NEGATIVE NEGATIVE Final   Influenza B by PCR NEGATIVE NEGATIVE Final    Comment: (NOTE) The Xpert Xpress SARS-CoV-2/FLU/RSV plus assay is intended as an aid in the diagnosis of influenza from Nasopharyngeal swab specimens and should not be used as a sole basis for treatment. Nasal washings and aspirates are unacceptable for Xpert Xpress SARS-CoV-2/FLU/RSV testing.  Fact Sheet for Patients: EntrepreneurPulse.com.au  Fact Sheet for Healthcare Providers: IncredibleEmployment.be  This test is not yet approved or cleared by the [place] FDA and has been authorized for detection and/or diagnosis of SARS-CoV-2 by FDA under an Emergency Use Authorization (EUA). This EUA will remain in effect  (meaning this test can be used) for the duration of the COVID-19 declaration under Section 564(b)(1) of the Act, 21 U.S.C. section 360bbb-3(b)(1), unless the authorization is terminated or revoked.  Performed at [facility], [address] [place], [place] [address]   MRSA Next Gen by PCR, Nasal     Status: None   Collection Time: 02/28/21  4:45 AM   Specimen: Nasal Mucosa; Nasal Swab  Result Value Ref Range Status   MRSA by PCR Next Gen NOT DETECTED NOT DETECTED Final    Comment: (NOTE) The GeneXpert MRSA Assay (FDA approved for NASAL specimens only), is one component of a comprehensive MRSA colonization surveillance program. It is not intended to diagnose MRSA infection nor to guide or monitor treatment for MRSA infections. Test performance is not FDA approved in patients less than 48 years old. Performed at [facility], [address], [place], [place] [address]          Radiology Studies: CARDIAC CATHETERIZATION  Result Date: 03/03/2021   Prox RCA to Mid RCA lesion is 30% stenosed.   RPDA lesion is 50% stenosed.   Prox RCA lesion is 95% stenosed.   A drug-eluting stent was successfully placed using a STENT ONYX FRONTIER 3.5X18.   Post intervention, there is a 0% residual stenosis. Conclusion: Coronary artery disease including eccentric 95% stenosis of the proximal RCA. Normal left ventricular end-diastolic pressure of 10 mmHg Successful PCI to the proximal RCA with placement of a 3.5 x 18 mm Onyx DES (postdilated to 3.75 mm at 18 ATM) with excellent angiographic result and TIMI-3 flow. Recommendation: Dual antiplatelet therapy with aspirin and Brilinta for 12 months, ideally longer TR band protocol. Management of heart failure.  Consider addition of Entresto as an outpatient if blood pressure allows. Follow-up with cardiologist 1 to 2 weeks after discharge.        Scheduled Meds:  aspirin       aspirin  81 mg Oral Daily   atorvastatin  80 mg Oral  Daily   Chlorhexidine Gluconate Cloth  6 each Topical Daily   enoxaparin (LOVENOX) injection  40 mg Subcutaneous Q24H   losartan  12.5 mg Oral Daily   melatonin  5 mg Oral QHS   metoprolol tartrate  25 mg Oral BID   sodium chloride flush  3 mL Intravenous Q12H   sodium chloride flush  3 mL Intravenous Q12H   sodium chloride flush  3 mL Intravenous Q12H   ticagrelor  90 mg Oral BID   Continuous Infusions:  sodium chloride     sodium chloride     sodium chloride       LOS: 3 days    Time spent: 25 min    Sidney Ace, MD [facility] Pager [phone]  If 7PM-7AM, please contact night-coverage 03/03/2021, 1:52  PM

## 2021-03-03 NOTE — Progress Notes (Signed)
[facility] Card[facility]  CARDIOLOGY CONSULT NOTE  Patient ID: Robert Berry MRN: [id number] DOB/AGE: 12-24-1965 55 y.o.  Admit date: 02/28/2021 Referring Physician [facility] Primary Physician  Primary Cardiologist  Reason for Consultation anterior ST elevation myocardial infarction  HPI: 55 year old gentleman referred for evaluation of anterior ST elevation myocardial infarction.  His usual state of health until 3:30 in the afternoon on 02/27/2021 when he developed substernal chest tightness associated with diaphoresis and nausea. S/p PCI to LAD (heavily calcified, two overlapping DES), with residual disease in RCA. EF 25% with anteroapical akinesis.   Interval history: - No acute events overnight. - PCI completed this morning to proximal RCA.   Review of systems complete and found to be negative unless listed above     Past Medical History:  Diagnosis Date   Hypertension     Past Surgical History:  Procedure Laterality Date   CORONARY/GRAFT ACUTE MI REVASCULARIZATION N/A 02/28/2021   Procedure: Coronary/Graft Acute MI Revascularization;  Surgeon: Isaias Cowman, MD;  Location: [facility];  Service: Cardiovascular;  Laterality: N/A;   facial construction L side Left    LEFT HEART CATH AND CORONARY ANGIOGRAPHY N/A 02/28/2021   Procedure: LEFT HEART CATH AND CORONARY ANGIOGRAPHY;  Surgeon: Isaias Cowman, MD;  Location: [facility];  Service: Cardiovascular;  Laterality: N/A;    No medications prior to admission.    Social History   Socioeconomic History   Marital status: Married    Spouse name: Not on file   Number of children: Not on file   Years of education: Not on file   Highest education level: Not on file  Occupational History   Not on file  Tobacco Use   Smoking status: Never   Smokeless tobacco: Never  Substance and Sexual Activity   Alcohol use: No   Drug use: No   Sexual activity: Not on file  Other Topics Concern   Not on file   Social History Narrative   Not on file   Social Determinants of Health   Financial Resource Strain: Not on file  Food Insecurity: Not on file  Transportation Needs: Not on file  Physical Activity: Not on file  Stress: Not on file  Social Connections: Not on file  Intimate Partner Violence: Not on file    No family history on file.    Review of systems complete and found to be negative unless listed above      PHYSICAL EXAM  General: Well developed, well nourished, in no acute distress HEENT:  Normocephalic and atramatic Neck:  No JVD.  Lungs: Clear bilaterally to auscultation and percussion. Heart: HRRR . Normal S1 and S2 without gallops or murmurs.  Abdomen: Bowel sounds are positive, abdomen soft and non-tender  Msk:  Back normal, normal gait. Normal strength and tone for age. Extremities: No clubbing, cyanosis or edema.   Neuro: Alert and oriented X 3. Psych:  Good affect, responds appropriately  Labs:   Lab Results  Component Value Date   WBC 9.8 03/03/2021   HGB 16.1 03/03/2021   HCT 46.7 03/03/2021   MCV 89.0 03/03/2021   PLT 203 03/03/2021    Recent Labs  Lab 03/03/21 0631  NA 136  K 4.0  CL 99  CO2 24  BUN 18  CREATININE 1.10  CALCIUM 9.0  GLUCOSE 102*    No results found for: CKTOTAL, CKMB, CKMBINDEX, TROPONINI No results found for: CHOL No results found for: HDL No results found for: LDLCALC No results found  for: TRIG No results found for: CHOLHDL No results found for: LDLDIRECT    Radiology: CARDIAC CATHETERIZATION  Result Date: 03/03/2021   Prox RCA to Mid RCA lesion is 30% stenosed.   RPDA lesion is 50% stenosed.   Prox RCA lesion is 95% stenosed.   A drug-eluting stent was successfully placed using a STENT ONYX FRONTIER 3.5X18.   Post intervention, there is a 0% residual stenosis. Conclusion: Coronary artery disease including eccentric 95% stenosis of the proximal RCA. Normal left ventricular end-diastolic pressure of 10 mmHg  Successful PCI to the proximal RCA with placement of a 3.5 x 18 mm Onyx DES (postdilated to 3.75 mm at 18 ATM) with excellent angiographic result and TIMI-3 flow. Recommendation: Dual antiplatelet therapy with aspirin and Brilinta for 12 months, ideally longer TR band protocol. Management of heart failure.  Consider addition of Entresto as an outpatient if blood pressure allows. Follow-up with cardiologist 1 to 2 weeks after discharge.   CARDIAC CATHETERIZATION  Result Date: 02/28/2021   Prox RCA lesion is 95% stenosed.   Prox RCA to Mid RCA lesion is 30% stenosed.   RPDA lesion is 50% stenosed.   Prox LAD to Mid LAD lesion is 95% stenosed.   Prox LAD lesion is 95% stenosed.   Prox Cx lesion is 50% stenosed.   Mid LAD lesion is 30% stenosed.   Dist LAD lesion is 30% stenosed.   A drug-eluting stent was successfully placed using a STENT ONYX FRONTIER 2.75X22.   A drug-eluting stent was successfully placed using a STENT ONYX FRONTIER 3.0X22.   Post intervention, there is a 0% residual stenosis.   Post intervention, there is a 0% residual stenosis.   There is severe left ventricular systolic dysfunction.   The left ventricular ejection fraction is less than 25% by visual estimate. 1.  Anterior ST elevation myocardial infarction 2.  95% complex, heavily calcified proximal/mid LAD stenosis likely culprit lesion 3.  95% stenosis proximal RCA 4.  Dilated cardiomyopathy with severely reduced left ventricular function 5.  Successful primary PCI with overlapping DES proximal and mid LAD Recommendations 1.  Dual antiplatelet therapy uninterrupted for 1 year 2.  Start high intensity atorvastatin 80 mg daily 3.  Start metoprolol tartrate 25 mg twice daily 4.  2D echocardiogram 5.  Stage PCI RCA for 03/03/2021   [facility] Chest Port 1 View  Result Date: 02/28/2021 CLINICAL DATA:  Shortness of breath and chest pain. EXAM: PORTABLE CHEST 1 VIEW COMPARISON:  Chest radiograph dated 04/08/2018. FINDINGS: No focal consolidation,  pleural effusion or pneumothorax. The cardiac silhouette is within limits no acute osseous pathology. IMPRESSION: No active disease. Electronically Signed   By: Anner Crete M.D.   On: 02/28/2021 01:10   ECHOCARDIOGRAM COMPLETE  Result Date: 02/28/2021    ECHOCARDIOGRAM REPORT   Patient Name:   Robert Berry Date of Exam: 02/28/2021 Medical Rec #:  [id number]          Height:       68.0 in Accession #:    [id number]         Weight:       146.2 lb Date of Birth:  [birth year]           BSA:          1.789 m Patient Age:    75 years           BP:           108/90 mmHg Patient Gender: M  HR:           85 bpm. Exam Location:  [facility] Procedure: 2D Echo, Cardiac Doppler, Color Doppler and Strain Analysis Indications:     Acute myocardial infarction--Unspecified I21.9  History:         Patient has no prior history of Echocardiogram examinations.                  Risk Factors:Hypertension.  Sonographer:     Sherrie Sport Referring Phys:  [place] Diagnosing Phys: Donnelly Angelica  Sonographer Comments: Global longitudinal strain was attempted. IMPRESSIONS  1. Left ventricular ejection fraction, by estimation, is 25 to 30%. The left ventricle has severely decreased function. The left ventricle demonstrates regional wall motion abnormalities (see scoring diagram/findings for description). Left ventricular diastolic parameters are consistent with Grade I diastolic dysfunction (impaired relaxation).  2. Right ventricular systolic function is normal. The right ventricular size is normal.  3. The mitral valve is normal in structure. Mild mitral valve regurgitation.  4. The aortic valve is normal in structure. Aortic valve regurgitation is not visualized. No aortic stenosis is present.  5. Aortic dilatation noted. There is mild dilatation of the ascending aorta, measuring 38 mm. FINDINGS  Left Ventricle: Left ventricular ejection fraction, by estimation, is 25 to 30%. The left ventricle has severely  decreased function. The left ventricle demonstrates regional wall motion abnormalities. The left ventricular internal cavity size was normal  in size. There is no left ventricular hypertrophy. Left ventricular diastolic parameters are consistent with Grade I diastolic dysfunction (impaired relaxation).  LV Wall Scoring: The mid and distal anterior wall, mid and distal anterior septum, mid inferoseptal segment, and apex are akinetic. The apical lateral segment is normal. Right Ventricle: The right ventricular size is normal. No increase in right ventricular wall thickness. Right ventricular systolic function is normal. Left Atrium: Left atrial size was normal in size. Right Atrium: Right atrial size was normal in size. Pericardium: There is no evidence of pericardial effusion. Mitral Valve: The mitral valve is normal in structure. Mild mitral valve regurgitation. Tricuspid Valve: The tricuspid valve is normal in structure. Tricuspid valve regurgitation is trivial. Aortic Valve: The aortic valve is normal in structure. Aortic valve regurgitation is not visualized. No aortic stenosis is present. Aortic valve mean gradient measures 2.0 mmHg. Aortic valve peak gradient measures 3.7 mmHg. Aortic valve area, by VTI measures 2.50 cm. Pulmonic Valve: The pulmonic valve was not well visualized. Pulmonic valve regurgitation is not visualized. Aorta: Aortic dilatation noted. There is mild dilatation of the ascending aorta, measuring 38 mm. IAS/Shunts: The interatrial septum was not assessed.  LEFT VENTRICLE PLAX 2D LVIDd:         4.90 cm      Diastology LVIDs:         4.10 cm      LV e' medial:    5.22 cm/s LV PW:         0.90 cm      LV E/e' medial:  13.1 LV IVS:        0.75 cm      LV e' lateral:   7.83 cm/s LVOT diam:     2.00 cm      LV E/e' lateral: 8.8 LV SV:         39 LV SV Index:   22 LVOT Area:     3.14 cm  LV Volumes (MOD) LV vol d, MOD A2C: 98.7 ml LV vol d, MOD A4C: 106.0 ml  LV vol s, MOD A2C: 56.9 ml LV vol s,  MOD A4C: 66.7 ml LV SV MOD A2C:     41.8 ml LV SV MOD A4C:     106.0 ml LV SV MOD BP:      42.1 ml RIGHT VENTRICLE RV Basal diam:  2.50 cm RV S prime:     10.90 cm/s TAPSE (M-mode): 3.8 cm LEFT ATRIUM           Index       RIGHT ATRIUM           Index LA diam:      4.00 cm 2.24 cm/m  RA Area:     10.60 cm LA Vol (A2C): 56.8 ml 31.73 ml/m RA Volume:   22.70 ml  12.69 ml/m LA Vol (A4C): 18.9 ml 10.57 ml/m  AORTIC VALVE                   PULMONIC VALVE AV Area (Vmax):    2.32 cm    PV Vmax:        1.10 m/s AV Area (Vmean):   2.28 cm    PV Peak grad:   4.8 mmHg AV Area (VTI):     2.50 cm    RVOT Peak grad: 3 mmHg AV Vmax:           96.10 cm/s AV Vmean:          65.300 cm/s AV VTI:            0.157 m AV Peak Grad:      3.7 mmHg AV Mean Grad:      2.0 mmHg LVOT Vmax:         71.10 cm/s LVOT Vmean:        47.400 cm/s LVOT VTI:          0.125 m LVOT/AV VTI ratio: 0.80  AORTA Ao Root diam: 3.43 cm MITRAL VALVE               TRICUSPID VALVE MV Area (PHT): 5.16 cm    TR Peak grad:   23.0 mmHg MV Decel Time: 147 msec    TR Vmax:        240.00 cm/s MV E velocity: 68.60 cm/s MV A velocity: 50.60 cm/s  SHUNTS MV E/A ratio:  1.36        Systemic VTI:  0.12 m                            Systemic Diam: 2.00 cm Donnelly Angelica Electronically signed by Donnelly Angelica Signature Date/Time: 02/28/2021/9:09:38 AM    Final     EKG: Sinus rhythm with ST elevation in leads V2 through V5  ASSESSMENT AND PLAN:   1.  Anterior ST elevation myocardial infarction, with new onset chest pain, ST elevation leads V2 through V5- s/p DES x 2 to mid LAD 2. CAD with residual high grade stenosis in proximal RCA s/p 3.5 x 18 mm Onyx DES 3. Ischemic cardiomyopathy with EF 25% with anterior akinesis 4.  Essential hypertension, blood pressure elevated 5. SVT on 9/24, self resolving.  Recommendations  - Continue aspirin and ticagrelor 90 mg BID for 12 months--> Patient does not have insurance. May need to switch to plavix prior to discharge. -  Continue lipitor 80 mg daily - Continue metoprlol 25 mg BID - Losartan 12.5 mg daily - Discussed [facility] at discharge. Will need to investigate Monday/Tuesday, though patient  does not have insurance.    Signed: Andrez Grime MD 03/03/2021, 8:55 AM

## 2021-03-04 ENCOUNTER — Other Ambulatory Visit: Payer: Self-pay

## 2021-03-04 LAB — CBC
HCT: 45.2 % (ref 39.0–52.0)
Hemoglobin: 15.1 g/dL (ref 13.0–17.0)
MCH: 29.2 pg (ref 26.0–34.0)
MCHC: 33.4 g/dL (ref 30.0–36.0)
MCV: 87.4 fL (ref 80.0–100.0)
Platelets: 246 10*3/uL (ref 150–400)
RBC: 5.17 MIL/uL (ref 4.22–5.81)
RDW: 13 % (ref 11.5–15.5)
WBC: 10 10*3/uL (ref 4.0–10.5)
nRBC: 0 % (ref 0.0–0.2)

## 2021-03-04 LAB — BASIC METABOLIC PANEL
Anion gap: 10 (ref 5–15)
BUN: 22 mg/dL — ABNORMAL HIGH (ref 6–20)
CO2: 25 mmol/L (ref 22–32)
Calcium: 8.9 mg/dL (ref 8.9–10.3)
Chloride: 100 mmol/L (ref 98–111)
Creatinine, Ser: 1.07 mg/dL (ref 0.61–1.24)
GFR, Estimated: >60 mL/min (ref >60)
Glucose, Bld: 103 mg/dL — ABNORMAL HIGH (ref 70–99)
Potassium: 4.6 mmol/L (ref 3.5–5.1)
Sodium: 135 mmol/L (ref 135–145)

## 2021-03-04 MED ORDER — METOPROLOL SUCCINATE ER 50 MG PO TB24: 50.0000 mg | ORAL_TABLET | Freq: Every day | ORAL | Status: DC

## 2021-03-04 MED ORDER — TICAGRELOR 90 MG PO TABS: 90.0000 mg | ORAL_TABLET | Freq: Two times a day (BID) | ORAL | 0 refills | Status: AC

## 2021-03-04 MED ORDER — NITROGLYCERIN 0.4 MG SL SUBL
0.4000 mg | SUBLINGUAL_TABLET | SUBLINGUAL | 0 refills | Status: AC | PRN
  Filled 2021-03-04: qty 25, 25d supply, fill #0

## 2021-03-04 MED ORDER — ATORVASTATIN CALCIUM 80 MG PO TABS
80.0000 mg | ORAL_TABLET | Freq: Every day | ORAL | 0 refills | Status: AC
  Filled 2021-03-04: qty 30, 30d supply, fill #0

## 2021-03-04 MED ORDER — LOPERAMIDE HCL 2 MG PO CAPS
2.0000 mg | ORAL_CAPSULE | ORAL | Status: DC | PRN
  Administered 2021-03-04: 2 mg via ORAL
  Filled 2021-03-04 (×3): qty 1

## 2021-03-04 MED ORDER — METOPROLOL SUCCINATE ER 50 MG PO TB24
75.0000 mg | ORAL_TABLET | Freq: Every day | ORAL | Status: DC
  Administered 2021-03-04: 75 mg via ORAL
  Filled 2021-03-04: qty 1

## 2021-03-04 MED ORDER — ASPIRIN 81 MG PO CHEW
81.0000 mg | CHEWABLE_TABLET | Freq: Every day | ORAL | 0 refills | Status: AC
  Filled 2021-03-04: qty 30, 30d supply, fill #0

## 2021-03-04 MED ORDER — LOSARTAN POTASSIUM 25 MG PO TABS
12.5000 mg | ORAL_TABLET | Freq: Every day | ORAL | 0 refills | Status: AC
  Filled 2021-03-04: qty 15, 30d supply, fill #0

## 2021-03-04 MED ORDER — METOPROLOL SUCCINATE ER 25 MG PO TB24
75.0000 mg | ORAL_TABLET | Freq: Every day | ORAL | 0 refills | Status: AC
  Filled 2021-03-04: qty 90, 30d supply, fill #0

## 2021-03-04 NOTE — Progress Notes (Signed)
EKG shows STEMI this morning. Pt asymptomatic. When patient get up to use bathroom, HR elevate to 130 nonsustained. Dr Arville Care notified. Will keep monitoring.   03/04/21 0520  Assess: MEWS Score  Temp 98.1 F (36.7 C)  BP 117/88  Pulse Rate (!) 101  Resp 20  SpO2 97 %  O2 Device Room Air  Assess: MEWS Score  MEWS Temp 0  MEWS Systolic 0  MEWS Pulse 1  MEWS RR 0  MEWS LOC 0  MEWS Score 1  MEWS Score Color Green  Assess: SIRS CRITERIA  SIRS Temperature  0  SIRS Pulse 1  SIRS Respirations  0  SIRS WBC 0  SIRS Score Sum  1

## 2021-03-04 NOTE — Progress Notes (Signed)
CSW faxed requested clinicals to Alvino Chapel with [facility] at [phone].   [place], [place] [phone]

## 2021-03-04 NOTE — Discharge Summary (Signed)
Physician Discharge Summary  Robert Berry GXQ:[id number] DOB: [birth year] DOA: March 16, 2021  PCP: Patient, No Pcp Per (Inactive)  Admit date: Mar 16, 2021 Discharge date: 03/04/2021  Admitted From: Home Disposition: Home  Recommendations for Outpatient Follow-up:  Follow up with PCP in 1-2 weeks Follow-up with cardiology [facility] within 1 week  Home Health: No Equipment/Devices: LifeVest placed 9/27  Discharge Condition: Stable CODE STATUS: Full Diet recommendation: Heart healthy  Brief/Interim Summary:  55 year old male with medical history of dyslipidemia and essential hypertension.  Strong family history of coronary artery disease who presented to the ED with acute onset of intermittent chest pain.  Presentation on arrival demonstrated marked hypertension and tachypnea.  High-sensitivity troponin initially 182.  EKG with anteroseptal STEMI with ST elevation in V2 through V4 with reciprocal T wave inversions.  Code STEMI activated.  Patient taken emergently to Cath Lab.  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  Also had dilated cardiomyopathy with EF 20%.  Underwent catheterization and PCI with DES deployed to LAD.   Discussed with cardiology.  Cath completed 9/26.  Stent to RCA is in place.  Patient evaluated post procedure.  Stable no distress.  Tolerating p.o.  Chest pain and resolved.  Stable for discharge on 9/27.  LifeVest to be placed on patient before discharge.  Follow-up outpatient cardiology.  Goal-directed medical therapy with aspirin, statin, beta-blocker, ACE inhibitor, antiplatelet have been prescribed.  Patient will discharge on 1 month of Brilinta and thereafter will transition to Plavix as he is uninsured.  Discharge Diagnoses:  Active Problems:   STEMI (ST elevation myocardial infarction) ([place]) Acute STEMI Multivessel coronary artery disease Ischemic cardiomyopathy EF 25% Essential hypertension Patient with strong family  history of early cardiac death STEMI with LAD stent 03-17-23 Return to Cath Lab 9/26, RCA stent placed Plan: Stable for discharge at this point.  Will discharge on aspirin and ticagrelor.  Ticagrelor x1 month then will transition to Plavix.  Metoprolol 75 mg daily.  Losartan 12.5 mg daily.  High intensity statin.  Discharge home with outpatient follow-up with cardiology arranged.   Discharge Instructions  Discharge Instructions     AMB Referral to Cardiac Rehabilitation - Phase II   Complete by: As directed    Diagnosis: STEMI   After initial evaluation and assessments completed: Virtual Based Care may be provided alone or in conjunction with Phase 2 Cardiac Rehab based on patient barriers.: Yes   AMB Referral to Cardiac Rehabilitation - Phase II   Complete by: As directed    Diagnosis:  STEMI Coronary Stents     After initial evaluation and assessments completed: Virtual Based Care may be provided alone or in conjunction with Phase 2 Cardiac Rehab based on patient barriers.: Yes   Diet - low sodium heart healthy   Complete by: As directed    Increase activity slowly   Complete by: As directed       Allergies as of 03/04/2021   No Known Allergies      Medication List     TAKE these medications    aspirin 81 MG chewable tablet Chew 1 tablet (81 mg total) by mouth once daily.   atorvastatin 80 MG tablet Commonly known as: LIPITOR Take 1 tablet (80 mg total) by mouth once daily.   losartan 25 MG tablet Commonly known as: COZAAR Take (1/2) tablet (12.5 mg total) by mouth once daily.   metoprolol succinate 25 MG 24 hr tablet Commonly known as: TOPROL-XL Take 3  tablets (75 mg total) by mouth once daily.   nitroGLYCERIN 0.4 MG SL tablet Commonly known as: NITROSTAT Dissolve 1 tablet (0.4 mg total) under the tongue once every 5 (five) minutes as needed for chest pain. (Max of 3 doses in 15 minutes).   ticagrelor 90 MG Tabs tablet Commonly known as: BRILINTA Take 1 tablet  (90 mg total) by mouth 2 (two) times daily.        Follow-up Information     Andrez Grime, MD. Go on 03/10/2021.   Specialty: Cardiology Why: _0 :Max Sane information: [place] [id number] [phone]                No Known Allergies  Consultations: Cardiology-[facility]   Procedures/Studies: CARDIAC CATHETERIZATION  Result Date: 03/03/2021   Prox RCA to Mid RCA lesion is 30% stenosed.   RPDA lesion is 50% stenosed.   Prox RCA lesion is 95% stenosed.   A drug-eluting stent was successfully placed using a STENT ONYX FRONTIER 3.5X18.   Post intervention, there is a 0% residual stenosis. Conclusion: Coronary artery disease including eccentric 95% stenosis of the proximal RCA. Normal left ventricular end-diastolic pressure of 10 mmHg Successful PCI to the proximal RCA with placement of a 3.5 x 18 mm Onyx DES (postdilated to 3.75 mm at 18 ATM) with excellent angiographic result and TIMI-3 flow. Recommendation: Dual antiplatelet therapy with aspirin and Brilinta for 12 months, ideally longer TR band protocol. Management of heart failure.  Consider addition of Entresto as an outpatient if blood pressure allows. Follow-up with cardiologist 1 to 2 weeks after discharge.   CARDIAC CATHETERIZATION  Result Date: 02/28/2021   Prox RCA lesion is 95% stenosed.   Prox RCA to Mid RCA lesion is 30% stenosed.   RPDA lesion is 50% stenosed.   Prox LAD to Mid LAD lesion is 95% stenosed.   Prox LAD lesion is 95% stenosed.   Prox Cx lesion is 50% stenosed.   Mid LAD lesion is 30% stenosed.   Dist LAD lesion is 30% stenosed.   A drug-eluting stent was successfully placed using a STENT ONYX FRONTIER 2.75X22.   A drug-eluting stent was successfully placed using a STENT ONYX FRONTIER 3.0X22.   Post intervention, there is a 0% residual stenosis.   Post intervention, there is a 0% residual stenosis.   There is severe left ventricular systolic dysfunction.   The left  ventricular ejection fraction is less than 25% by visual estimate. 1.  Anterior ST elevation myocardial infarction 2.  95% complex, heavily calcified proximal/mid LAD stenosis likely culprit lesion 3.  95% stenosis proximal RCA 4.  Dilated cardiomyopathy with severely reduced left ventricular function 5.  Successful primary PCI with overlapping DES proximal and mid LAD Recommendations 1.  Dual antiplatelet therapy uninterrupted for 1 year 2.  Start high intensity atorvastatin 80 mg daily 3.  Start metoprolol tartrate 25 mg twice daily 4.  2D echocardiogram 5.  Stage PCI RCA for 03/03/2021   [facility] Chest Port 1 View  Result Date: 02/28/2021 CLINICAL DATA:  Shortness of breath and chest pain. EXAM: PORTABLE CHEST 1 VIEW COMPARISON:  Chest radiograph dated 04/08/2018. FINDINGS: No focal consolidation, pleural effusion or pneumothorax. The cardiac silhouette is within limits no acute osseous pathology. IMPRESSION: No active disease. Electronically Signed   By: Anner Crete M.D.   On: 02/28/2021 01:10   ECHOCARDIOGRAM COMPLETE  Result Date: 02/28/2021    ECHOCARDIOGRAM REPORT   Patient Name:   Robert Berry  Swenson Date of Exam: 02/28/2021 Medical Rec #:  [id number]          Height:       68.0 in Accession #:    [id number]         Weight:       146.2 lb Date of Birth:  08-09-1965           BSA:          1.789 m Patient Age:    66 years           BP:           108/90 mmHg Patient Gender: M                  HR:           85 bpm. Exam Location:  [facility] Procedure: 2D Echo, Cardiac Doppler, Color Doppler and Strain Analysis Indications:     Acute myocardial infarction--Unspecified I21.9  History:         Patient has no prior history of Echocardiogram examinations.                  Risk Factors:Hypertension.  Sonographer:     Sherrie Sport Referring Phys:  [place] Diagnosing Phys: Donnelly Angelica  Sonographer Comments: Global longitudinal strain was attempted. IMPRESSIONS  1. Left ventricular ejection fraction, by  estimation, is 25 to 30%. The left ventricle has severely decreased function. The left ventricle demonstrates regional wall motion abnormalities (see scoring diagram/findings for description). Left ventricular diastolic parameters are consistent with Grade I diastolic dysfunction (impaired relaxation).  2. Right ventricular systolic function is normal. The right ventricular size is normal.  3. The mitral valve is normal in structure. Mild mitral valve regurgitation.  4. The aortic valve is normal in structure. Aortic valve regurgitation is not visualized. No aortic stenosis is present.  5. Aortic dilatation noted. There is mild dilatation of the ascending aorta, measuring 38 mm. FINDINGS  Left Ventricle: Left ventricular ejection fraction, by estimation, is 25 to 30%. The left ventricle has severely decreased function. The left ventricle demonstrates regional wall motion abnormalities. The left ventricular internal cavity size was normal  in size. There is no left ventricular hypertrophy. Left ventricular diastolic parameters are consistent with Grade I diastolic dysfunction (impaired relaxation).  LV Wall Scoring: The mid and distal anterior wall, mid and distal anterior septum, mid inferoseptal segment, and apex are akinetic. The apical lateral segment is normal. Right Ventricle: The right ventricular size is normal. No increase in right ventricular wall thickness. Right ventricular systolic function is normal. Left Atrium: Left atrial size was normal in size. Right Atrium: Right atrial size was normal in size. Pericardium: There is no evidence of pericardial effusion. Mitral Valve: The mitral valve is normal in structure. Mild mitral valve regurgitation. Tricuspid Valve: The tricuspid valve is normal in structure. Tricuspid valve regurgitation is trivial. Aortic Valve: The aortic valve is normal in structure. Aortic valve regurgitation is not visualized. No aortic stenosis is present. Aortic valve mean gradient  measures 2.0 mmHg. Aortic valve peak gradient measures 3.7 mmHg. Aortic valve area, by VTI measures 2.50 cm. Pulmonic Valve: The pulmonic valve was not well visualized. Pulmonic valve regurgitation is not visualized. Aorta: Aortic dilatation noted. There is mild dilatation of the ascending aorta, measuring 38 mm. IAS/Shunts: The interatrial septum was not assessed.  LEFT VENTRICLE PLAX 2D LVIDd:         4.90 cm      Diastology  LVIDs:         4.10 cm      LV e' medial:    5.22 cm/s LV PW:         0.90 cm      LV E/e' medial:  13.1 LV IVS:        0.75 cm      LV e' lateral:   7.83 cm/s LVOT diam:     2.00 cm      LV E/e' lateral: 8.8 LV SV:         39 LV SV Index:   22 LVOT Area:     3.14 cm  LV Volumes (MOD) LV vol d, MOD A2C: 98.7 ml LV vol d, MOD A4C: 106.0 ml LV vol s, MOD A2C: 56.9 ml LV vol s, MOD A4C: 66.7 ml LV SV MOD A2C:     41.8 ml LV SV MOD A4C:     106.0 ml LV SV MOD BP:      42.1 ml RIGHT VENTRICLE RV Basal diam:  2.50 cm RV S prime:     10.90 cm/s TAPSE (M-mode): 3.8 cm LEFT ATRIUM           Index       RIGHT ATRIUM           Index LA diam:      4.00 cm 2.24 cm/m  RA Area:     10.60 cm LA Vol (A2C): 56.8 ml 31.73 ml/m RA Volume:   22.70 ml  12.69 ml/m LA Vol (A4C): 18.9 ml 10.57 ml/m  AORTIC VALVE                   PULMONIC VALVE AV Area (Vmax):    2.32 cm    PV Vmax:        1.10 m/s AV Area (Vmean):   2.28 cm    PV Peak grad:   4.8 mmHg AV Area (VTI):     2.50 cm    RVOT Peak grad: 3 mmHg AV Vmax:           96.10 cm/s AV Vmean:          65.300 cm/s AV VTI:            0.157 m AV Peak Grad:      3.7 mmHg AV Mean Grad:      2.0 mmHg LVOT Vmax:         71.10 cm/s LVOT Vmean:        47.400 cm/s LVOT VTI:          0.125 m LVOT/AV VTI ratio: 0.80  AORTA Ao Root diam: 3.43 cm MITRAL VALVE               TRICUSPID VALVE MV Area (PHT): 5.16 cm    TR Peak grad:   23.0 mmHg MV Decel Time: 147 msec    TR Vmax:        240.00 cm/s MV E velocity: 68.60 cm/s MV A velocity: 50.60 cm/s  SHUNTS MV E/A ratio:   1.36        Systemic VTI:  0.12 m                            Systemic Diam: 2.00 cm Donnelly Angelica Electronically signed by Donnelly Angelica Signature Date/Time: 02/28/2021/9:09:38 AM    Final    (Echo, Carotid, EGD, Colonoscopy, ERCP)    Subjective: Seen and  examined at the time of discharge.  Stable no distress.  In good spirits.  Chest pain-free.  Stable for discharge home.  Discharge Exam: Vitals:   03/04/21 0814 03/04/21 1157  BP: (!) 146/97 119/89  Pulse: 98 86  Resp: 17 18  Temp: 97.6 F (36.4 C) 98.7 F (37.1 C)  SpO2: 99% 97%   Vitals:   03/04/21 0003 03/04/21 0520 03/04/21 0814 03/04/21 1157  BP: 126/85 117/88 (!) 146/97 119/89  Pulse: 76 (!) 101 98 86  Resp: $Remo'20 20 17 18  'mHwdC$ Temp: 98.1 F (36.7 C) 98.1 F (36.7 C) 97.6 F (36.4 C) 98.7 F (37.1 C)  TempSrc: Oral Oral  Oral  SpO2: 98% 97% 99% 97%  Weight:      Height:        General: Pt is alert, awake, not in acute distress Cardiovascular: RRR, S1/S2 +, no rubs, no gallops Respiratory: CTA bilaterally, no wheezing, no rhonchi Abdominal: Soft, NT, ND, bowel sounds + Extremities: no edema, no cyanosis    The results of significant diagnostics from this hospitalization (including imaging, microbiology, ancillary and laboratory) are listed below for reference.     Microbiology: Recent Results (from the past 240 hour(s))  Resp Panel by RT-PCR (Flu A&B, Covid) Nasopharyngeal Swab     Status: None   Collection Time: 02/28/21 12:52 AM   Specimen: Nasopharyngeal Swab; Nasopharyngeal(NP) swabs in vial transport medium  Result Value Ref Range Status   SARS Coronavirus 2 by RT PCR NEGATIVE NEGATIVE Final    Comment: (NOTE) SARS-CoV-2 target nucleic acids are NOT DETECTED.  The SARS-CoV-2 RNA is generally detectable in upper respiratory specimens during the acute phase of infection. The lowest concentration of SARS-CoV-2 viral copies this assay can detect is 138 copies/mL. A negative result does not preclude  SARS-Cov-2 infection and should not be used as the sole basis for treatment or other patient management decisions. A negative result may occur with  improper specimen collection/handling, submission of specimen other than nasopharyngeal swab, presence of viral mutation(s) within the areas targeted by this assay, and inadequate number of viral copies(<138 copies/mL). A negative result must be combined with clinical observations, patient history, and epidemiological information. The expected result is Negative.  Fact Sheet for Patients:  EntrepreneurPulse.com.au  Fact Sheet for Healthcare Providers:  IncredibleEmployment.be  This test is no t yet approved or cleared by the [place] FDA and  has been authorized for detection and/or diagnosis of SARS-CoV-2 by FDA under an Emergency Use Authorization (EUA). This EUA will remain  in effect (meaning this test can be used) for the duration of the COVID-19 declaration under Section 564(b)(1) of the Act, 21 U.S.C.section 360bbb-3(b)(1), unless the authorization is terminated  or revoked sooner.       Influenza A by PCR NEGATIVE NEGATIVE Final   Influenza B by PCR NEGATIVE NEGATIVE Final    Comment: (NOTE) The Xpert Xpress SARS-CoV-2/FLU/RSV plus assay is intended as an aid in the diagnosis of influenza from Nasopharyngeal swab specimens and should not be used as a sole basis for treatment. Nasal washings and aspirates are unacceptable for Xpert Xpress SARS-CoV-2/FLU/RSV testing.  Fact Sheet for Patients: EntrepreneurPulse.com.au  Fact Sheet for Healthcare Providers: IncredibleEmployment.be  This test is not yet approved or cleared by the [place] FDA and has been authorized for detection and/or diagnosis of SARS-CoV-2 by FDA under an Emergency Use Authorization (EUA). This EUA will remain in effect (meaning this test can be used) for the duration of  the COVID-19  declaration under Section 564(b)(1) of the Act, 21 U.S.C. section 360bbb-3(b)(1), unless the authorization is terminated or revoked.  Performed at [facility], [address] [place], [place] [address]   MRSA Next Gen by PCR, Nasal     Status: None   Collection Time: 02/28/21  4:45 AM   Specimen: Nasal Mucosa; Nasal Swab  Result Value Ref Range Status   MRSA by PCR Next Gen NOT DETECTED NOT DETECTED Final    Comment: (NOTE) The GeneXpert MRSA Assay (FDA approved for NASAL specimens only), is one component of a comprehensive MRSA colonization surveillance program. It is not intended to diagnose MRSA infection nor to guide or monitor treatment for MRSA infections. Test performance is not FDA approved in patients less than 26 years old. Performed at [facility], [address] [place], [place] [address]      Labs: BNP (last 3 results) Recent Labs    03/02/21 0625  BNP 810.1*   Basic Metabolic Panel: Recent Labs  Lab 02/28/21 0557 03/01/21 0901 03/02/21 0625 03/03/21 0631 03/04/21 0422  NA 134* 135 135 136 135  K 3.7 4.4 4.5 4.0 4.6  CL 102 99 99 99 100  CO2 _0 GLUCOSE 136* 112* 108* 102* 103*  BUN _1 22*  CREATININE 0.96 1.18 1.26* 1.10 1.07  CALCIUM 8.4* 9.1 9.1 9.0 8.9  MG 2.1 2.1 2.0  --   --    Liver Function Tests: No results for input(s): AST, ALT, ALKPHOS, BILITOT, PROT, ALBUMIN in the last 168 hours. No results for input(s): LIPASE, AMYLASE in the last 168 hours. No results for input(s): AMMONIA in the last 168 hours. CBC: Recent Labs  Lab 02/28/21 0052 02/28/21 0557 03/03/21 0631 03/04/21 0422  WBC 12.0* 13.4* 9.8 10.0  HGB 15.5 14.3 16.1 15.1  HCT 45.8 40.8 46.7 45.2  MCV 89.1 87.2 89.0 87.4  PLT 244 193 203 246   Cardiac Enzymes: No results for input(s): CKTOTAL, CKMB, CKMBINDEX, TROPONINI in the last 168 hours. BNP: Invalid input(s): POCBNP CBG: Recent Labs  Lab 02/28/21 0419   GLUCAP 137*   D-Dimer No results for input(s): DDIMER in the last 72 hours. Hgb A1c No results for input(s): HGBA1C in the last 72 hours. Lipid Profile No results for input(s): CHOL, HDL, LDLCALC, TRIG, CHOLHDL, LDLDIRECT in the last 72 hours. Thyroid function studies No results for input(s): TSH, T4TOTAL, T3FREE, THYROIDAB in the last 72 hours.  Invalid input(s): FREET3 Anemia work up No results for input(s): VITAMINB12, FOLATE, FERRITIN, TIBC, IRON, RETICCTPCT in the last 72 hours. Urinalysis No results found for: COLORURINE, APPEARANCEUR, [place], [place], GLUCOSEU, [place], [place], [place], PROTEINUR, UROBILINOGEN, NITRITE, LEUKOCYTESUR Sepsis Labs Invalid input(s): PROCALCITONIN,  WBC,  LACTICIDVEN Microbiology Recent Results (from the past 240 hour(s))  Resp Panel by RT-PCR (Flu A&B, Covid) Nasopharyngeal Swab     Status: None   Collection Time: 02/28/21 12:52 AM   Specimen: Nasopharyngeal Swab; Nasopharyngeal(NP) swabs in vial transport medium  Result Value Ref Range Status   SARS Coronavirus 2 by RT PCR NEGATIVE NEGATIVE Final    Comment: (NOTE) SARS-CoV-2 target nucleic acids are NOT DETECTED.  The SARS-CoV-2 RNA is generally detectable in upper respiratory specimens during the acute phase of infection. The lowest concentration of SARS-CoV-2 viral copies this assay can detect is 138 copies/mL. A negative result does not preclude SARS-Cov-2 infection and should not be used as the sole basis for treatment or other patient management decisions. A  negative result may occur with  improper specimen collection/handling, submission of specimen other than nasopharyngeal swab, presence of viral mutation(s) within the areas targeted by this assay, and inadequate number of viral copies(<138 copies/mL). A negative result must be combined with clinical observations, patient history, and epidemiological information. The expected result is Negative.  Fact Sheet for Patients:   EntrepreneurPulse.com.au  Fact Sheet for Healthcare Providers:  IncredibleEmployment.be  This test is no t yet approved or cleared by the [place] FDA and  has been authorized for detection and/or diagnosis of SARS-CoV-2 by FDA under an Emergency Use Authorization (EUA). This EUA will remain  in effect (meaning this test can be used) for the duration of the COVID-19 declaration under Section 564(b)(1) of the Act, 21 U.S.C.section 360bbb-3(b)(1), unless the authorization is terminated  or revoked sooner.       Influenza A by PCR NEGATIVE NEGATIVE Final   Influenza B by PCR NEGATIVE NEGATIVE Final    Comment: (NOTE) The Xpert Xpress SARS-CoV-2/FLU/RSV plus assay is intended as an aid in the diagnosis of influenza from Nasopharyngeal swab specimens and should not be used as a sole basis for treatment. Nasal washings and aspirates are unacceptable for Xpert Xpress SARS-CoV-2/FLU/RSV testing.  Fact Sheet for Patients: EntrepreneurPulse.com.au  Fact Sheet for Healthcare Providers: IncredibleEmployment.be  This test is not yet approved or cleared by the [place] FDA and has been authorized for detection and/or diagnosis of SARS-CoV-2 by FDA under an Emergency Use Authorization (EUA). This EUA will remain in effect (meaning this test can be used) for the duration of the COVID-19 declaration under Section 564(b)(1) of the Act, 21 U.S.C. section 360bbb-3(b)(1), unless the authorization is terminated or revoked.  Performed at [facility], [address] [place], [place] [address]   MRSA Next Gen by PCR, Nasal     Status: None   Collection Time: 02/28/21  4:45 AM   Specimen: Nasal Mucosa; Nasal Swab  Result Value Ref Range Status   MRSA by PCR Next Gen NOT DETECTED NOT DETECTED Final    Comment: (NOTE) The GeneXpert MRSA Assay (FDA approved for NASAL specimens only), is one component of a  comprehensive MRSA colonization surveillance program. It is not intended to diagnose MRSA infection nor to guide or monitor treatment for MRSA infections. Test performance is not FDA approved in patients less than 59 years old. Performed at [facility], [address] [place], [place] [address]      Time coordinating discharge: Over 30 minutes  SIGNED:   Sidney Ace, MD  [facility] 03/04/2021, 2:15 PM Pager   If 7PM-7AM, please contact night-coverage

## 2021-03-04 NOTE — Progress Notes (Signed)
[facility]  CARDIOLOGY CONSULT NOTE  Patient ID: Robert Berry MRN: [id number] DOB/AGE: 1965-07-30 55 y.o.  Admit date: 02/28/2021 Referring Physician [facility] Primary Physician  Primary Cardiologist  Reason for Consultation anterior ST elevation myocardial infarction  HPI: 55 year old gentleman referred for evaluation of anterior ST elevation myocardial infarction.  His usual state of health until 3:30 in the afternoon on 02/27/2021 when he developed substernal chest tightness associated with diaphoresis and nausea. S/p PCI to LAD (heavily calcified, two overlapping DES), with residual disease in RCA. EF 25% with anteroapical akinesis.   Interval history: - Feels good this morning.  - Denies chest pain, shortness of breath.  - No acute events.   Review of systems complete and found to be negative unless listed above     Past Medical History:  Diagnosis Date   Hypertension     Past Surgical History:  Procedure Laterality Date   CORONARY STENT INTERVENTION N/A 03/03/2021   Procedure: CORONARY STENT INTERVENTION;  Surgeon: Andrez Grime, MD;  Location: [place] [facility];  Service: Cardiovascular;  Laterality: N/A;   CORONARY/GRAFT ACUTE MI REVASCULARIZATION N/A 02/28/2021   Procedure: Coronary/Graft Acute MI Revascularization;  Surgeon: Isaias Cowman, MD;  Location: [place] [facility];  Service: Cardiovascular;  Laterality: N/A;   facial construction L side Left    LEFT HEART CATH AND CORONARY ANGIOGRAPHY N/A 02/28/2021   Procedure: LEFT HEART CATH AND CORONARY ANGIOGRAPHY;  Surgeon: Isaias Cowman, MD;  Location: [place] [facility];  Service: Cardiovascular;  Laterality: N/A;    No medications prior to admission.    Social History   Socioeconomic History   Marital status: Married    Spouse name: Not on file   Number of children: Not on file   Years of education: Not on file   Highest education level: Not on file  Occupational History   Not  on file  Tobacco Use   Smoking status: Never   Smokeless tobacco: Never  Substance and Sexual Activity   Alcohol use: No   Drug use: No   Sexual activity: Not on file  Other Topics Concern   Not on file  Social History Narrative   Not on file   Social Determinants of Health   Financial Resource Strain: Not on file  Food Insecurity: Not on file  Transportation Needs: Not on file  Physical Activity: Not on file  Stress: Not on file  Social Connections: Not on file  Intimate Partner Violence: Not on file    No family history on file.    Review of systems complete and found to be negative unless listed above      PHYSICAL EXAM  General: Well developed, well nourished, in no acute distress HEENT:  Normocephalic and atramatic Neck:  No JVD.  Lungs: Clear bilaterally to auscultation and percussion. Heart: HRRR . Normal S1 and S2 without gallops or murmurs.  Abdomen: Bowel sounds are positive, abdomen soft and non-tender  Msk:  Back normal, normal gait. Normal strength and tone for age. Extremities: No clubbing, cyanosis or edema.   Neuro: Alert and oriented X 3. Psych:  Good affect, responds appropriately  Labs:   Lab Results  Component Value Date   WBC 10.0 03/04/2021   HGB 15.1 03/04/2021   HCT 45.2 03/04/2021   MCV 87.4 03/04/2021   PLT 246 03/04/2021    Recent Labs  Lab 03/04/21 0422  NA 135  K 4.6  CL 100  CO2 25  BUN 22*  CREATININE  1.07  CALCIUM 8.9  GLUCOSE 103*    No results found for: CKTOTAL, CKMB, CKMBINDEX, TROPONINI No results found for: CHOL No results found for: HDL No results found for: LDLCALC No results found for: TRIG No results found for: CHOLHDL No results found for: LDLDIRECT    Radiology: CARDIAC CATHETERIZATION  Result Date: 03/03/2021   Prox RCA to Mid RCA lesion is 30% stenosed.   RPDA lesion is 50% stenosed.   Prox RCA lesion is 95% stenosed.   A drug-eluting stent was successfully placed using a STENT ONYX FRONTIER  3.5X18.   Post intervention, there is a 0% residual stenosis. Conclusion: Coronary artery disease including eccentric 95% stenosis of the proximal RCA. Normal left ventricular end-diastolic pressure of 10 mmHg Successful PCI to the proximal RCA with placement of a 3.5 x 18 mm Onyx DES (postdilated to 3.75 mm at 18 ATM) with excellent angiographic result and TIMI-3 flow. Recommendation: Dual antiplatelet therapy with aspirin and Brilinta for 12 months, ideally longer TR band protocol. Management of heart failure.  Consider addition of Entresto as an outpatient if blood pressure allows. Follow-up with cardiologist 1 to 2 weeks after discharge.   CARDIAC CATHETERIZATION  Result Date: 02/28/2021   Prox RCA lesion is 95% stenosed.   Prox RCA to Mid RCA lesion is 30% stenosed.   RPDA lesion is 50% stenosed.   Prox LAD to Mid LAD lesion is 95% stenosed.   Prox LAD lesion is 95% stenosed.   Prox Cx lesion is 50% stenosed.   Mid LAD lesion is 30% stenosed.   Dist LAD lesion is 30% stenosed.   A drug-eluting stent was successfully placed using a STENT ONYX FRONTIER 2.75X22.   A drug-eluting stent was successfully placed using a STENT ONYX FRONTIER 3.0X22.   Post intervention, there is a 0% residual stenosis.   Post intervention, there is a 0% residual stenosis.   There is severe left ventricular systolic dysfunction.   The left ventricular ejection fraction is less than 25% by visual estimate. 1.  Anterior ST elevation myocardial infarction 2.  95% complex, heavily calcified proximal/mid LAD stenosis likely culprit lesion 3.  95% stenosis proximal RCA 4.  Dilated cardiomyopathy with severely reduced left ventricular function 5.  Successful primary PCI with overlapping DES proximal and mid LAD Recommendations 1.  Dual antiplatelet therapy uninterrupted for 1 year 2.  Start high intensity atorvastatin 80 mg daily 3.  Start metoprolol tartrate 25 mg twice daily 4.  2D echocardiogram 5.  Stage PCI RCA for 03/03/2021   [facility]  Chest Port 1 View  Result Date: 02/28/2021 CLINICAL DATA:  Shortness of breath and chest pain. EXAM: PORTABLE CHEST 1 VIEW COMPARISON:  Chest radiograph dated 04/08/2018. FINDINGS: No focal consolidation, pleural effusion or pneumothorax. The cardiac silhouette is within limits no acute osseous pathology. IMPRESSION: No active disease. Electronically Signed   By: Anner Crete M.D.   On: 02/28/2021 01:10   ECHOCARDIOGRAM COMPLETE  Result Date: 02/28/2021    ECHOCARDIOGRAM REPORT   Patient Name:   Robert Berry Date of Exam: 02/28/2021 Medical Rec #:  [id number]          Height:       68.0 in Accession #:    [id number]         Weight:       146.2 lb Date of Birth:  August 03, 1965           BSA:          1.789  m Patient Age:    1 years           BP:           108/90 mmHg Patient Gender: M                  HR:           85 bpm. Exam Location:  [facility] Procedure: 2D Echo, Cardiac Doppler, Color Doppler and Strain Analysis Indications:     Acute myocardial infarction--Unspecified I21.9  History:         Patient has no prior history of Echocardiogram examinations.                  Risk Factors:Hypertension.  Sonographer:     Sherrie Sport Referring Phys:  [place] Diagnosing Phys: Donnelly Angelica  Sonographer Comments: Global longitudinal strain was attempted. IMPRESSIONS  1. Left ventricular ejection fraction, by estimation, is 25 to 30%. The left ventricle has severely decreased function. The left ventricle demonstrates regional wall motion abnormalities (see scoring diagram/findings for description). Left ventricular diastolic parameters are consistent with Grade I diastolic dysfunction (impaired relaxation).  2. Right ventricular systolic function is normal. The right ventricular size is normal.  3. The mitral valve is normal in structure. Mild mitral valve regurgitation.  4. The aortic valve is normal in structure. Aortic valve regurgitation is not visualized. No aortic stenosis is present.  5. Aortic  dilatation noted. There is mild dilatation of the ascending aorta, measuring 38 mm. FINDINGS  Left Ventricle: Left ventricular ejection fraction, by estimation, is 25 to 30%. The left ventricle has severely decreased function. The left ventricle demonstrates regional wall motion abnormalities. The left ventricular internal cavity size was normal  in size. There is no left ventricular hypertrophy. Left ventricular diastolic parameters are consistent with Grade I diastolic dysfunction (impaired relaxation).  LV Wall Scoring: The mid and distal anterior wall, mid and distal anterior septum, mid inferoseptal segment, and apex are akinetic. The apical lateral segment is normal. Right Ventricle: The right ventricular size is normal. No increase in right ventricular wall thickness. Right ventricular systolic function is normal. Left Atrium: Left atrial size was normal in size. Right Atrium: Right atrial size was normal in size. Pericardium: There is no evidence of pericardial effusion. Mitral Valve: The mitral valve is normal in structure. Mild mitral valve regurgitation. Tricuspid Valve: The tricuspid valve is normal in structure. Tricuspid valve regurgitation is trivial. Aortic Valve: The aortic valve is normal in structure. Aortic valve regurgitation is not visualized. No aortic stenosis is present. Aortic valve mean gradient measures 2.0 mmHg. Aortic valve peak gradient measures 3.7 mmHg. Aortic valve area, by VTI measures 2.50 cm. Pulmonic Valve: The pulmonic valve was not well visualized. Pulmonic valve regurgitation is not visualized. Aorta: Aortic dilatation noted. There is mild dilatation of the ascending aorta, measuring 38 mm. IAS/Shunts: The interatrial septum was not assessed.  LEFT VENTRICLE PLAX 2D LVIDd:         4.90 cm      Diastology LVIDs:         4.10 cm      LV e' medial:    5.22 cm/s LV PW:         0.90 cm      LV E/e' medial:  13.1 LV IVS:        0.75 cm      LV e' lateral:   7.83 cm/s LVOT diam:      2.00 cm  LV E/e' lateral: 8.8 LV SV:         39 LV SV Index:   22 LVOT Area:     3.14 cm  LV Volumes (MOD) LV vol d, MOD A2C: 98.7 ml LV vol d, MOD A4C: 106.0 ml LV vol s, MOD A2C: 56.9 ml LV vol s, MOD A4C: 66.7 ml LV SV MOD A2C:     41.8 ml LV SV MOD A4C:     106.0 ml LV SV MOD BP:      42.1 ml RIGHT VENTRICLE RV Basal diam:  2.50 cm RV S prime:     10.90 cm/s TAPSE (M-mode): 3.8 cm LEFT ATRIUM           Index       RIGHT ATRIUM           Index LA diam:      4.00 cm 2.24 cm/m  RA Area:     10.60 cm LA Vol (A2C): 56.8 ml 31.73 ml/m RA Volume:   22.70 ml  12.69 ml/m LA Vol (A4C): 18.9 ml 10.57 ml/m  AORTIC VALVE                   PULMONIC VALVE AV Area (Vmax):    2.32 cm    PV Vmax:        1.10 m/s AV Area (Vmean):   2.28 cm    PV Peak grad:   4.8 mmHg AV Area (VTI):     2.50 cm    RVOT Peak grad: 3 mmHg AV Vmax:           96.10 cm/s AV Vmean:          65.300 cm/s AV VTI:            0.157 m AV Peak Grad:      3.7 mmHg AV Mean Grad:      2.0 mmHg LVOT Vmax:         71.10 cm/s LVOT Vmean:        47.400 cm/s LVOT VTI:          0.125 m LVOT/AV VTI ratio: 0.80  AORTA Ao Root diam: 3.43 cm MITRAL VALVE               TRICUSPID VALVE MV Area (PHT): 5.16 cm    TR Peak grad:   23.0 mmHg MV Decel Time: 147 msec    TR Vmax:        240.00 cm/s MV E velocity: 68.60 cm/s MV A velocity: 50.60 cm/s  SHUNTS MV E/A ratio:  1.36        Systemic VTI:  0.12 m                            Systemic Diam: 2.00 cm Donnelly Angelica Electronically signed by Donnelly Angelica Signature Date/Time: 02/28/2021/9:09:38 AM    Final     EKG: Sinus rhythm with ST elevation in leads V2 through V5  ASSESSMENT AND PLAN:   1.  Anterior ST elevation myocardial infarction, with new onset chest pain, ST elevation leads V2 through V5- s/p DES x 2 to mid LAD 2. CAD with residual high grade stenosis in proximal RCA s/p 3.5 x 18 mm Onyx DES 3. Ischemic cardiomyopathy with EF 25% with anterior akinesis 4.  Essential hypertension, blood pressure  elevated 5. SVT on 9/24, self resolving.  Recommendations  - Continue aspirin and ticagrelor 90 mg BID -->  Will continue for 1 month, then transition to plavix (no insurance) - Continue lipitor 80 mg daily - Metoprolol XL 75 mg daily - Losartan 12.5 mg daily - Patient to have life vest placed today - F/u with me on 10/3 at 10 am   Signed: Andrez Grime MD 03/04/2021, 8:09 AM

## 2021-03-12 NOTE — Progress Notes (Signed)
Patient ID: Robert Berry, male    DOB: 1965/12/07, 55 y.o.   MRN: [id number]  HPI  Robert Berry is a 55 y/o male with a history of CAD, HTN and chronic heart failure.   Echo report from 02/28/21 reviewed and showed an EF of 25-30% along with mild Robert.   LHC with PCI done 02/28/21 and showed:   Prox RCA lesion is 95% stenosed.   Prox RCA to Mid RCA lesion is 30% stenosed.   RPDA lesion is 50% stenosed.   Prox LAD to Mid LAD lesion is 95% stenosed.   Prox LAD lesion is 95% stenosed.   Prox Cx lesion is 50% stenosed.   Mid LAD lesion is 30% stenosed.   Dist LAD lesion is 30% stenosed.   A drug-eluting stent was successfully placed using a STENT ONYX FRONTIER 2.75X22.   A drug-eluting stent was successfully placed using a STENT ONYX FRONTIER 3.0X22.   Post intervention, there is a 0% residual stenosis.   Post intervention, there is a 0% residual stenosis.   There is severe left ventricular systolic dysfunction.   The left ventricular ejection fraction is less than 25% by visual estimate.   1.  Anterior ST elevation myocardial infarction 2.  95% complex, heavily calcified proximal/mid LAD stenosis likely culprit lesion 3.  95% stenosis proximal RCA 4.  Dilated cardiomyopathy with severely reduced left ventricular function 5.  Successful primary PCI with overlapping DES proximal and mid LAD  Staged PCI done 03/03/21: Prox RCA to Mid RCA lesion is 30% stenosed.   RPDA lesion is 50% stenosed.   Prox RCA lesion is 95% stenosed.   A drug-eluting stent was successfully placed using a STENT ONYX FRONTIER 3.5X18.   Post intervention, there is a 0% residual stenosis.   Conclusion: Coronary artery disease including eccentric 95% stenosis of the proximal RCA. Normal left ventricular end-diastolic pressure of 10 mmHg Successful PCI to the proximal RCA with placement of a 3.5 x 18 mm Onyx DES (postdilated to 3.75 mm at 18 ATM) with excellent angiographic result and TIMI-3 flow.  Admitted 02/28/21  due to acute chest pain. Noted to be HTN and tachypneic. EKG showed STEMI. Cardiology consult obtained. Went to cath lab and had stent placed in LAD with staged PCI in RCA done a few days later. Lifevest applied. Brilinta for 30 days and then to be transitioned to plavix. Discharged after 4 days.   Patient presents today for with a chief complaint of an initial visit. He currently doesn't have any symptoms and specifically denies any difficulty sleeping, dizziness, abdominal distention, palpitations, pedal edema, chest pain, shortness of breath, cough, fatigue or weight gain.   Says that overall, he feels great. Says that he's weighing himself daily along with checking his BP/ HR daily. Has completed revamped his diet and isn't eating at fast food anymore or drinking sweet tea. Only drinking water now and has been reading food labels for sodium/ fat content.   Past Medical History:  Diagnosis Date   CHF (congestive heart failure) ([place])    Coronary artery disease    Hypertension    Past Surgical History:  Procedure Laterality Date   CORONARY STENT INTERVENTION N/A 03/03/2021   Procedure: CORONARY STENT INTERVENTION;  Surgeon: Andrez Grime, MD;  Location: [place] [facility];  Service: Cardiovascular;  Laterality: N/A;   CORONARY/GRAFT ACUTE MI REVASCULARIZATION N/A 02/28/2021   Procedure: Coronary/Graft Acute MI Revascularization;  Surgeon: Isaias Cowman, MD;  Location: [place] [facility];  Service: Cardiovascular;  Laterality: N/A;   facial construction L side Left    LEFT HEART CATH AND CORONARY ANGIOGRAPHY N/A 02/28/2021   Procedure: LEFT HEART CATH AND CORONARY ANGIOGRAPHY;  Surgeon: Isaias Cowman, MD;  Location: [place] [facility];  Service: Cardiovascular;  Laterality: N/A;   History reviewed. No pertinent family history. Social History   Tobacco Use   Smoking status: Never   Smokeless tobacco: Never  Substance Use Topics   Alcohol use: No   No Known  Allergies Prior to Admission medications   Medication Sig Start Date End Date Taking? Authorizing Provider  aspirin 81 MG chewable tablet Chew 1 tablet (81 mg total) by mouth once daily. 03/04/21 04/03/21 Yes Sreenath, Sudheer B, MD  atorvastatin (LIPITOR) 80 MG tablet Take 1 tablet (80 mg total) by mouth once daily. 03/04/21 04/03/21 Yes Sreenath, Sudheer B, MD  losartan (COZAAR) 25 MG tablet Take (1/2) tablet (12.5 mg total) by mouth once daily. 03/04/21 04/03/21 Yes Sreenath, Sudheer B, MD  metoprolol succinate (TOPROL-XL) 25 MG 24 hr tablet Take 3 tablets (75 mg total) by mouth once daily. 03/04/21 04/03/21 Yes Sreenath, Sudheer B, MD  nitroGLYCERIN (NITROSTAT) 0.4 MG SL tablet Dissolve 1 tablet (0.4 mg total) under the tongue once every 5 (five) minutes as needed for chest pain. (Max of 3 doses in 15 minutes). 03/04/21  Yes Sreenath, Sudheer B, MD  ticagrelor (BRILINTA) 90 MG TABS tablet Take 1 tablet (90 mg total) by mouth 2 (two) times daily. 03/04/21 04/03/21 Yes Sidney Ace, MD    Review of Systems  Constitutional:  Negative for appetite change and fatigue.  HENT:  Negative for congestion, postnasal drip and sore throat.   Eyes: Negative.   Respiratory:  Negative for cough and shortness of breath.   Cardiovascular:  Negative for chest pain, palpitations and leg swelling.  Gastrointestinal:  Negative for abdominal distention and abdominal pain.  Endocrine: Negative.   Genitourinary: Negative.   Musculoskeletal:  Negative for back pain and neck pain.  Skin: Negative.   Allergic/Immunologic: Negative.   Neurological:  Negative for dizziness and light-headedness.  Hematological:  Negative for adenopathy. Does not bruise/bleed easily.  Psychiatric/Behavioral:  Negative for dysphoric mood and sleep disturbance (sleeping on 2 pillows). The patient is not nervous/anxious.    Vitals:   03/13/21 1053  BP: 111/76  Pulse: 75  Resp: 18  SpO2: 100%  Weight: 146 lb (66.2 kg)  Height: 5'  8" (1.727 m)   Wt Readings from Last 3 Encounters:  03/13/21 146 lb (66.2 kg)  03/03/21 146 lb 2.6 oz (66.3 kg)  09/07/19 151 lb (68.5 kg)   Lab Results  Component Value Date   CREATININE 1.07 03/04/2021   CREATININE 1.10 03/03/2021   CREATININE 1.26 (H) 03/02/2021   Physical Exam Vitals and nursing note reviewed.  Constitutional:      Appearance: Normal appearance.  HENT:     Head: Normocephalic and atraumatic.  Cardiovascular:     Rate and Rhythm: Normal rate and regular rhythm.  Pulmonary:     Effort: Pulmonary effort is normal. No respiratory distress.     Breath sounds: No wheezing or rales.  Abdominal:     General: There is no distension.     Palpations: Abdomen is soft.  Musculoskeletal:        General: No tenderness.     Cervical back: Normal range of motion.     Right lower leg: No edema.     Left lower  leg: No edema.  Skin:    General: Skin is warm and dry.  Neurological:     General: No focal deficit present.     Mental Status: He is alert and oriented to person, place, and time.  Psychiatric:        Mood and Affect: Mood normal.        Behavior: Behavior normal.        Thought Content: Thought content normal.    Assessment & Plan:  1: Chronic heart failure with reduced ejection fraction- - NYHA class I - euvolemic today - weighing daily and knows to call for an overnight weight gain of >2 pounds or a weekly weight gain of > 5 pounds; home weight chart reviewed.  - not adding salt and has been reading food labels for sodium content; low sodium cookbook given to patient - having repeat echo done in ~ 6 weeks with possible transition from losartan to entresto - currently on GDMT of metoprolol and losartan - continues to wear lifevest; denies any shocks delivered - BNP 03/02/21 was 166.8  2: HTN- - BP looks good (111/76) - home BP log reviewed and has had SBP in 90's - currently doesn't have a PCP but is hoping to get one soon - BMP 03/04/21 reviewed  and showed sodium 135, potassium 4.6, creatinine 1.07 & GFR >60  3: CAD- - recently had 3 stents placed; 2 in LAD and 1 in RCA - currently on brilinta but will then transition to plavix once he finishes his current RX of brilinta - saw cardiology Corky Sox) 03/10/21 - he is start cardiac rehab but hasn't heard from them; brochure provided and encouraged him to call them - says that he's going through the process of obtaining [facility] assistance   Medication bottles reviewed.   Return in 2 months or sooner for any questions/problems before then.

## 2021-03-13 ENCOUNTER — Other Ambulatory Visit: Payer: Self-pay

## 2021-03-13 ENCOUNTER — Encounter: Payer: Self-pay | Admitting: Family

## 2021-03-13 ENCOUNTER — Ambulatory Visit: Payer: Self-pay | Attending: Family | Admitting: Family

## 2021-03-13 VITALS — BP 111/76 | HR 75 | Resp 18 | Ht 68.0 in | Wt 146.0 lb

## 2021-03-13 DIAGNOSIS — Z7982 Long term (current) use of aspirin: Secondary | ICD-10-CM | POA: Insufficient documentation

## 2021-03-13 DIAGNOSIS — I110 Hypertensive heart disease with heart failure: Secondary | ICD-10-CM | POA: Insufficient documentation

## 2021-03-13 DIAGNOSIS — Z955 Presence of coronary angioplasty implant and graft: Secondary | ICD-10-CM | POA: Insufficient documentation

## 2021-03-13 DIAGNOSIS — I420 Dilated cardiomyopathy: Secondary | ICD-10-CM | POA: Insufficient documentation

## 2021-03-13 DIAGNOSIS — Z79899 Other long term (current) drug therapy: Secondary | ICD-10-CM | POA: Insufficient documentation

## 2021-03-13 DIAGNOSIS — I5022 Chronic systolic (congestive) heart failure: Secondary | ICD-10-CM | POA: Insufficient documentation

## 2021-03-13 DIAGNOSIS — I10 Essential (primary) hypertension: Secondary | ICD-10-CM

## 2021-03-13 DIAGNOSIS — I213 ST elevation (STEMI) myocardial infarction of unspecified site: Secondary | ICD-10-CM | POA: Insufficient documentation

## 2021-03-13 DIAGNOSIS — Z7902 Long term (current) use of antithrombotics/antiplatelets: Secondary | ICD-10-CM | POA: Insufficient documentation

## 2021-03-13 DIAGNOSIS — I2510 Atherosclerotic heart disease of native coronary artery without angina pectoris: Secondary | ICD-10-CM | POA: Insufficient documentation

## 2021-03-13 NOTE — Patient Instructions (Signed)
Continue weighing daily and call for an overnight weight gain of > 2 pounds or a weekly weight gain of >5 pounds. 

## 2021-05-12 NOTE — Progress Notes (Deleted)
Patient ID: Robert Berry, male    DOB: Dec 26, 1965, 55 y.o.   MRN: [id number]  HPI  Robert Berry is a 55 y/o male with a history of CAD, HTN and chronic heart failure.   Echo report from 02/28/21 reviewed and showed an EF of 25-30% along with mild Robert.   LHC with PCI done 02/28/21 and showed:   Prox RCA lesion is 95% stenosed.   Prox RCA to Mid RCA lesion is 30% stenosed.   RPDA lesion is 50% stenosed.   Prox LAD to Mid LAD lesion is 95% stenosed.   Prox LAD lesion is 95% stenosed.   Prox Cx lesion is 50% stenosed.   Mid LAD lesion is 30% stenosed.   Dist LAD lesion is 30% stenosed.   A drug-eluting stent was successfully placed using a STENT ONYX FRONTIER 2.75X22.   A drug-eluting stent was successfully placed using a STENT ONYX FRONTIER 3.0X22.   Post intervention, there is a 0% residual stenosis.   Post intervention, there is a 0% residual stenosis.   There is severe left ventricular systolic dysfunction.   The left ventricular ejection fraction is less than 25% by visual estimate.   1.  Anterior ST elevation myocardial infarction 2.  95% complex, heavily calcified proximal/mid LAD stenosis likely culprit lesion 3.  95% stenosis proximal RCA 4.  Dilated cardiomyopathy with severely reduced left ventricular function 5.  Successful primary PCI with overlapping DES proximal and mid LAD  Staged PCI done 03/03/21: Prox RCA to Mid RCA lesion is 30% stenosed.   RPDA lesion is 50% stenosed.   Prox RCA lesion is 95% stenosed.   A drug-eluting stent was successfully placed using a STENT ONYX FRONTIER 3.5X18.   Post intervention, there is a 0% residual stenosis.   Conclusion: Coronary artery disease including eccentric 95% stenosis of the proximal RCA. Normal left ventricular end-diastolic pressure of 10 mmHg Successful PCI to the proximal RCA with placement of a 3.5 x 18 mm Onyx DES (postdilated to 3.75 mm at 18 ATM) with excellent angiographic result and TIMI-3 flow.  Admitted 02/28/21  due to acute chest pain. Noted to be HTN and tachypneic. EKG showed STEMI. Cardiology consult obtained. Went to cath lab and had stent placed in LAD with staged PCI in RCA done a few days later. Lifevest applied. Brilinta for 30 days and then to be transitioned to plavix. Discharged after 4 days.   Patient presents today for with a chief complaint of a follow-up visit.   Past Medical History:  Diagnosis Date   CHF (congestive heart failure) ([place])    Coronary artery disease    Hypertension    Past Surgical History:  Procedure Laterality Date   CORONARY STENT INTERVENTION N/A 03/03/2021   Procedure: CORONARY STENT INTERVENTION;  Surgeon: Andrez Grime, MD;  Location: [facility];  Service: Cardiovascular;  Laterality: N/A;   CORONARY/GRAFT ACUTE MI REVASCULARIZATION N/A 02/28/2021   Procedure: Coronary/Graft Acute MI Revascularization;  Surgeon: Isaias Cowman, MD;  Location: [facility];  Service: Cardiovascular;  Laterality: N/A;   facial construction L side Left    LEFT HEART CATH AND CORONARY ANGIOGRAPHY N/A 02/28/2021   Procedure: LEFT HEART CATH AND CORONARY ANGIOGRAPHY;  Surgeon: Isaias Cowman, MD;  Location: [facility];  Service: Cardiovascular;  Laterality: N/A;   No family history on file. Social History   Tobacco Use   Smoking status: Never   Smokeless tobacco: Never  Substance Use Topics  Alcohol use: No   No Known Allergies   Review of Systems  Constitutional:  Negative for appetite change and fatigue.  HENT:  Negative for congestion, postnasal drip and sore throat.   Eyes: Negative.   Respiratory:  Negative for cough and shortness of breath.   Cardiovascular:  Negative for chest pain, palpitations and leg swelling.  Gastrointestinal:  Negative for abdominal distention and abdominal pain.  Endocrine: Negative.   Genitourinary: Negative.   Musculoskeletal:  Negative for back pain and neck pain.  Skin: Negative.    Allergic/Immunologic: Negative.   Neurological:  Negative for dizziness and light-headedness.  Hematological:  Negative for adenopathy. Does not bruise/bleed easily.  Psychiatric/Behavioral:  Negative for dysphoric mood and sleep disturbance (sleeping on 2 pillows). The patient is not nervous/anxious.      Physical Exam Vitals and nursing note reviewed.  Constitutional:      Appearance: Normal appearance.  HENT:     Head: Normocephalic and atraumatic.  Cardiovascular:     Rate and Rhythm: Normal rate and regular rhythm.  Pulmonary:     Effort: Pulmonary effort is normal. No respiratory distress.     Breath sounds: No wheezing or rales.  Abdominal:     General: There is no distension.     Palpations: Abdomen is soft.  Musculoskeletal:        General: No tenderness.     Cervical back: Normal range of motion.     Right lower leg: No edema.     Left lower leg: No edema.  Skin:    General: Skin is warm and dry.  Neurological:     General: No focal deficit present.     Mental Status: He is alert and oriented to person, place, and time.  Psychiatric:        Mood and Affect: Mood normal.        Behavior: Behavior normal.        Thought Content: Thought content normal.    Assessment & Plan:  1: Chronic heart failure with reduced ejection fraction- - NYHA class I - euvolemic today - weighing daily and knows to call for an overnight weight gain of >2 pounds or a weekly weight gain of > 5 pounds; home weight chart reviewed.  - weight 146 pounds from last visit here 2 months ago - not adding salt and has been reading food labels for sodium content - having repeat echo done in ~ 6 weeks with possible transition from losartan to entresto - currently on GDMT of metoprolol and losartan - continues to wear lifevest; denies any shocks delivered - BNP 03/02/21 was 166.8  2: HTN- - BP - home BP log reviewed and has had SBP in 90's - currently doesn't have a PCP but is hoping to get  one soon - BMP 03/04/21 reviewed and showed sodium 135, potassium 4.6, creatinine 1.07 & GFR >60  3: CAD- - recently had 3 stents placed; 2 in LAD and 1 in RCA - currently on brilinta but will then transition to plavix once he finishes his current RX of brilinta - saw cardiology Corky Sox) 03/10/21 - he is start cardiac rehab but hasn't heard from them; brochure provided and encouraged him to call them - says that he's going through the process of obtaining [facility] assistance   Medication bottles reviewed.

## 2021-05-13 ENCOUNTER — Telehealth: Payer: Self-pay | Admitting: Family

## 2021-05-13 ENCOUNTER — Ambulatory Visit: Payer: Self-pay | Admitting: Family

## 2021-05-13 NOTE — Telephone Encounter (Signed)
LVM in attempt to reschedule patients no show for [facility] Clinic appointment.    Berk Pilot, NT

## 2021-07-11 ENCOUNTER — Telehealth: Payer: Self-pay | Admitting: Pharmacist

## 2021-07-11 NOTE — Telephone Encounter (Signed)
Patient failed to provide requested 2023 financial documentation. No additional medication assistance will be provided by [facility] without the required proof of income documentation. Patient notified by letter. ? ?Deborah Barnes ?Medication Management ?

## 2021-07-22 ENCOUNTER — Other Ambulatory Visit: Payer: Self-pay

## 2021-07-25 ENCOUNTER — Telehealth: Payer: Self-pay | Admitting: Pharmacy Technician

## 2021-07-25 NOTE — Telephone Encounter (Signed)
Patient failed to provide 2023 proof of income.  No additional medication assistance will be provided by [facility] without the required proof of income documentation.  Patient notified by letter.  Sherilyn Dacosta Care Manager [facility]    [address], [place]  [address]  July 11, 2021    Dear Robert Berry:  This is to inform you that you are no longer eligible to receive medication assistance at [facility].  The reason(s) are:    _____Your total gross monthly household income exceeds 300% of the Federal Poverty Level.   _____Tangible assets (savings, checking, stocks/bonds, pension, retirement, etc.) exceeds our limit  _____You are eligible to receive benefits from [facility], [facility] or HIV Medication              Assistance Program _____You are eligible to receive benefits from a Medicare Part D plan _____You have prescription insurance  _____You are not an [facility] resident __X__Failure to provide all requested documentation (proof of income for 2023, and/or Patient Intake Application, DOH Attestation, Contract, etc).    Medication assistance will resume once all requested documentation has been returned to our clinic.  If you have questions, please contact our clinic at [phone].    Thank you,  [facility]

## 2022-01-07 IMAGING — DX DG CHEST 1V PORT
1 series · 1 of 1 positions shown · non-contrast
Comparison: Chest radiograph dated 04/08/2018.

CLINICAL DATA: Shortness of breath and chest pain.

EXAM:
PORTABLE CHEST 1 VIEW

[chest ap]
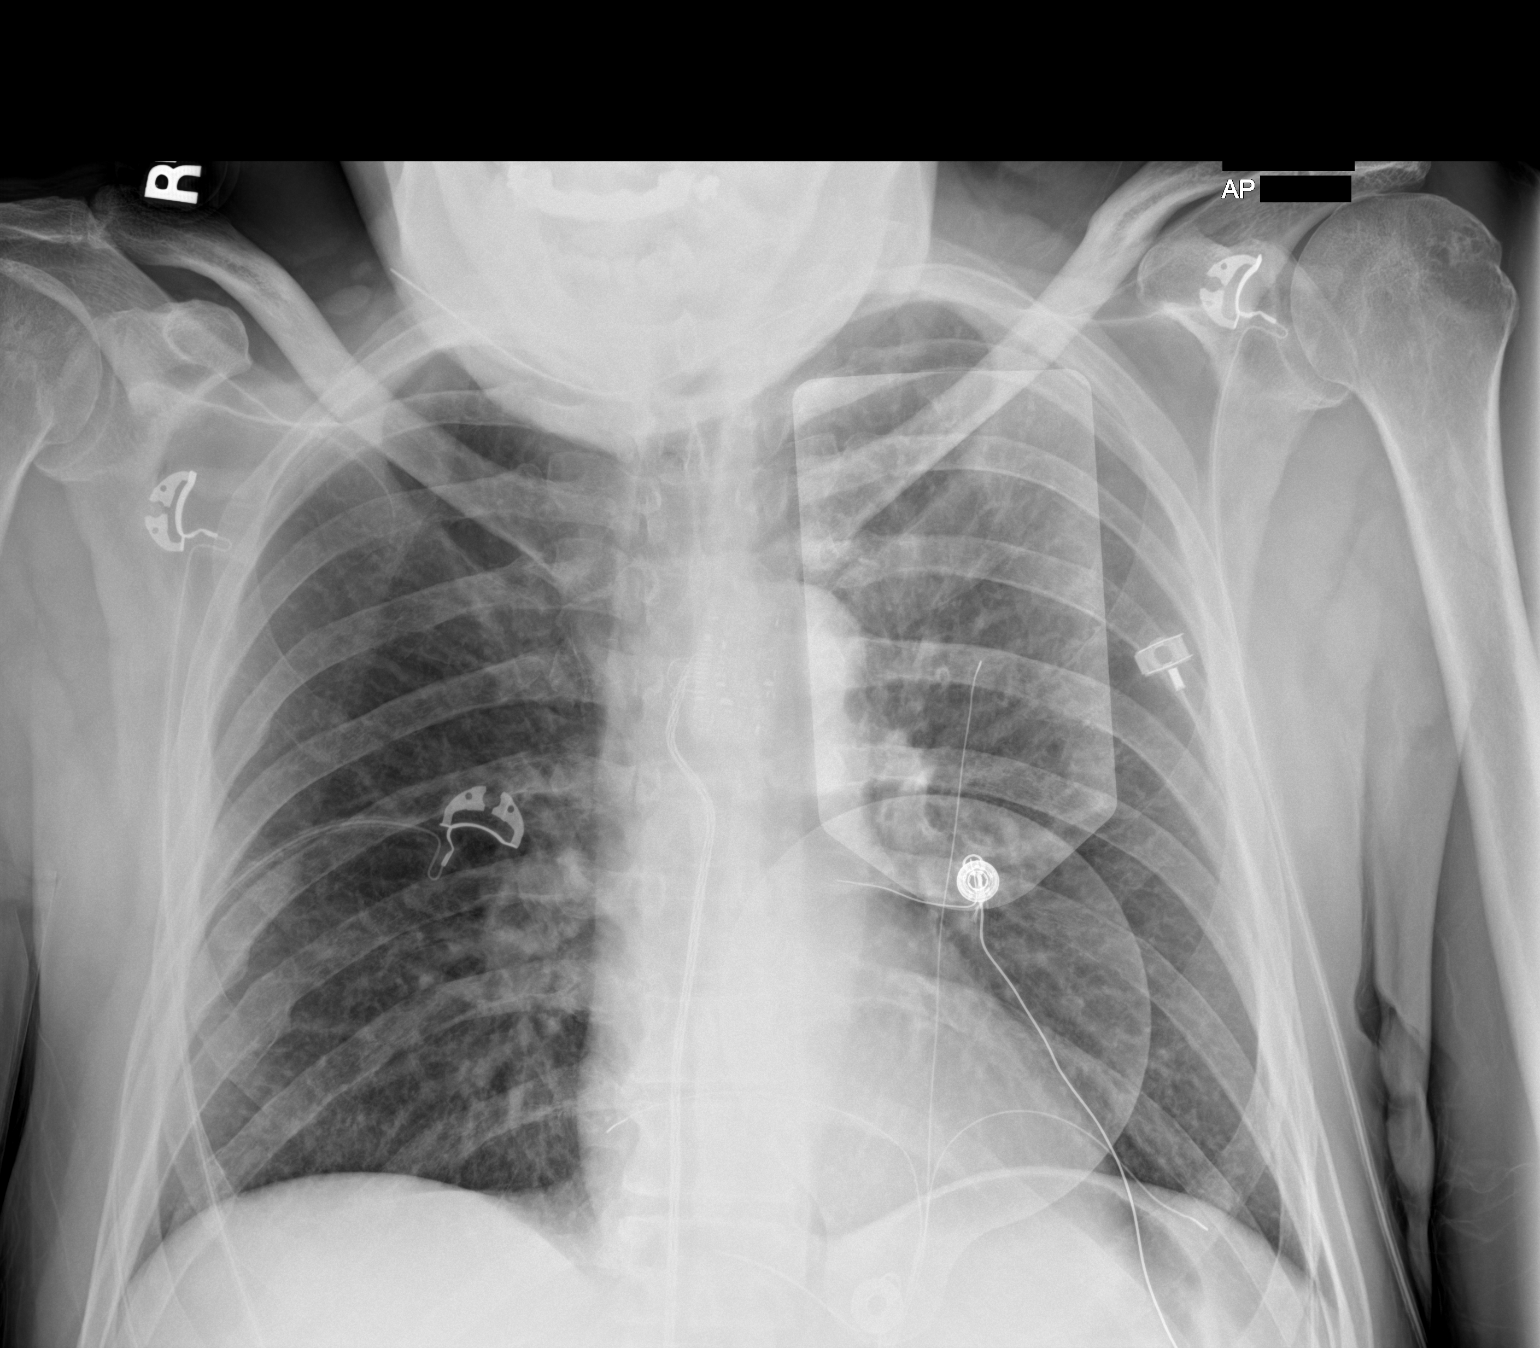

[1 of 1 positions shown; findings below may reference images not displayed]

FINDINGS: No focal consolidation, pleural effusion or pneumothorax. The
cardiac silhouette is within limits no acute osseous pathology.
IMPRESSION: No active disease.

## 2023-06-24 NOTE — Progress Notes (Signed)
 Patient Profile Robert Berry is a 58 y.o. male who presents to the [facility] for a new patient visit for evaluation and management of the problem(s) listed below:    Requesting Provider: Selinda Berry Berry,  PCP: Berry Robert Azalee, PA  Reason for visit: to schedule a colonoscopy for suspected hemorrhoidal bleeding  Patient's Chief Complaint:   Chief Complaint  Patient presents with  . New Patient  . Hemorrhoids     BRBPR (bright red blood per rectum)  (primary encounter diagnosis) Coronary artery disease involving native coronary artery of native heart without angina pectoris    HISTORY OF PRESENT ILLNESS   Robert Berry is a 58 yo male with history of HTN, CAD, STEMI with DES (2022) on Plavix, ischemic cardiomyopathy, diverticulitis (2017) is sent by his PCP for intermittent blood in the stool.   The patient reports he is here to set up a colonoscopy.  He has been seeing scant blood on the toilet tissue at random intervals last year.  He reports he has a formed stool  every morning like clock work. He may have a second movement in the morning.   He has had no diarrhea.  He has occasional sense of mild constipation, straining when he sees the blood. Stools are not hard, and he has no rectal itching, burning, or pain.  He will see scant blood on the tissue, a smear on the stool and a few drips in the commode-rare. No weight loss, anorexia, abdominal pain, change in bowel habits.   Rare heartburn with food indiscretion. No chest pain, dysphagia, regurgitation, epigastric pain, nausea/vomiting, or melena.        Negative tobacco or alcohol past or present.   Denies NSAIDs, Anti-plt agents, and anticoagulants Denies family history of gastrointestinal disease and malignancy  In preparation for the procedure: History of CAD, with anterior STEMI, complicated primary PCI with shockwave intracoronary lithotripsy and drug-eluting stent x2 proximal  LAD, and staged PCI with DES proximal RCA 03/03/2021.  On PLAVIX. Patient with mild ischemic cardiomyopathy, LVEF 40% by 2D echocardiogram 03/27/2021. He patient has hyperlipidemia, on high intensity atorvastatin .  No history of CVA, arthroplasty, sleep apnea, and problems with sedation.    Medications: Current Outpatient Medications  Medication Sig Dispense Refill  . aspirin  81 MG chewable tablet Take 81 mg by mouth once daily    . atorvastatin  (LIPITOR) 80 MG tablet take one tablet by mouth one time daily 90 tablet 1  . clopidogreL (PLAVIX) 75 mg tablet Take 1 tablet (75 mg total) by mouth once daily 90 tablet 3  . losartan  (COZAAR ) 50 MG tablet take one tablet by mouth one time daily 90 tablet 1  . metoprolol  succinate (TOPROL -XL) 25 MG XL tablet take one tablet by mouth one time daily 90 tablet 1  . nitroGLYcerin  (NITROSTAT ) 0.4 MG SL tablet Place 1 tablet (0.4 mg total) under the tongue every 5 (five) minutes as needed for Chest pain 25 tablet 1  . clotrimazole-betamethasone (LOTRISONE) 1-0.05 % cream Apply topically 2 (two) times daily (Patient not taking: Reported on 06/24/2023) 15 g 1  . peg-electrolyte (NULYTELY ) solution Take 4,000 mLs by mouth once for 1 dose 4000 mL 0   No current facility-administered medications for this visit.    Allergies: Patient has no known allergies.  Past Medical History: Past Medical History:  Diagnosis Date  . Hypertension   . Myocardial infarction (CMS/HHS-HCC)     Past Surgical History: No past surgical history on  file.   Family History: Family History  Problem Relation Name Age of Onset  . Asthma Mother    . Emphysema Mother    . Other Mother         collapsed lung  . Myocardial Infarction (Heart attack) Father    . Fibromyalgia Sister    . Other Sister         overdosed on pain medication  . Myocardial Infarction (Heart attack) Brother Robert Berry   . Heart failure Brother Robert Berry   . COPD Brother Robert Berry   . Myocardial Infarction (Heart  attack) Brother Robert Berry   . No Known Problems Brother Robert Berry   . No Known Problems Brother Robert Berry      Social History: Social History   Socioeconomic History  . Marital status: Married  Tobacco Use  . Smoking status: Never    Passive exposure: Past  . Smokeless tobacco: Never  Vaping Use  . Vaping status: Never Used  Substance and Sexual Activity  . Alcohol use: Not Currently  . Drug use: Yes    Comment: marijuana  . Sexual activity: Defer   Social Drivers of Health   Financial Resource Strain: Low Risk  (08/28/2022)   Overall Financial Resource Strain (CARDIA)   . Difficulty of Paying Living Expenses: Not hard at all  Food Insecurity: No Food Insecurity (08/28/2022)   Hunger Vital Sign   . Worried About Programme researcher, broadcasting/film/video in the Last Year: Never true   . Ran Out of Food in the Last Year: Never true  Transportation Needs: No Transportation Needs (08/28/2022)   PRAPARE - Transportation   . Lack of Transportation (Medical): No   . Lack of Transportation (Non-Medical): No     GENERAL REVIEW OF SYSTEMS   A ROS was performed including pertinent positive and negatives as documented.  All other systems are negative.  General: negative for - fever, chills Head: no injury,  headaches Eyes: no jaundice,  vision changes Nose: no injury, bleeding Mouth/Throat: no oral ulcers, sore throat, hoarseness Endocrine: no heat/cold intolerance Respiratory: no cough, wheezing, SOB Cardiovascular: no chest pain, palpitations GI: see HPI Musculoskeletal: no joint swelling, muscle/joint pain  Neurological: no seizures, syncope, dizziness, numbness/tingling Skin: no rashes, itching Hematological and Lymphatic: no easy bruising, easy bleeding   PHYSICAL EXAMINATION   Vitals:   06/24/23 1016  BP: 129/79  Pulse: 76  Temp: 36.3 C (97.4 F)   Body mass index is 24.15 kg/m. Weight: 73.1 kg (161 lb 3.2 oz)    Wt Readings from Last 3 Encounters:  06/24/23 73.1 kg (161 lb 3.2 oz)   05/04/23 70.5 kg (155 lb 6.4 oz)  02/23/23 68.9 kg (152 lb)    PHYSICAL EXAM: General: NAD, alert and oriented x 4 HEENT:  Anicteric Neck: supple, no JVD or thyromegaly. No lymphadenopathy.  Respiratory: CTA bilaterally, no wheezes, crackles, or other adventitious sounds Cardiac: RRR, no murmur, rub, or gallop  Abd: soft, normal bowel sounds, no TTP, no HSM, no rebound or guarding He declines DRE  Ext: no edema Skin: Skin color normal, no rashes  Lymph: no LAD Neuro: Grossly intact   REVIEW OF DATA   I have personally reviewed and interpreted the following data today:  Provider notes: Whitaker, Jason [place], PA (PCP)  Labs: No visits with results within 3 Month(s) from this visit.  Latest known visit with results is:  Initial consult on 08/28/2022  Component Date Value  . WBC (White Blood Cell Co* 08/28/2022 6.0   .  RBC (Red Blood Cell Coun* 08/28/2022 4.87   . Hemoglobin 08/28/2022 14.5   . Hematocrit 08/28/2022 43.5   . MCV (Mean Corpuscular Vo* 08/28/2022 89.3   . MCH (Mean Corpuscular He* 08/28/2022 29.8   . MCHC (Mean Corpuscular H* 08/28/2022 33.3   . Platelet Count 08/28/2022 224   . RDW-CV (Red Cell Distrib* 08/28/2022 13.2   . MPV (Mean Platelet Volum* 08/28/2022 11.7   . Neutrophils 08/28/2022 3.07   . Lymphocytes 08/28/2022 2.12   . Monocytes 08/28/2022 0.53   . Eosinophils 08/28/2022 0.24   . Basophils 08/28/2022 0.06   . Neutrophil % 08/28/2022 50.8   . Lymphocyte % 08/28/2022 35.2   . Monocyte % 08/28/2022 8.8   . Eosinophil % 08/28/2022 4.0   . Basophil% 08/28/2022 1.0   . Immature Granulocyte % 08/28/2022 0.2   . Immature Granulocyte Cou* 08/28/2022 0.01   . Glucose 08/28/2022 95   . Sodium 08/28/2022 136   . Potassium 08/28/2022 4.9   . Chloride 08/28/2022 101   . Carbon Dioxide (CO2) 08/28/2022 30.0   . Urea Nitrogen (BUN) 08/28/2022 21   . Creatinine 08/28/2022 1.1   . Glomerular Filtration Ra* 08/28/2022 79   . Calcium  08/28/2022 9.6    . AST  08/28/2022 27   . ALT  08/28/2022 26   . Alk Phos (alkaline Phosp* 08/28/2022 96   . Albumin 08/28/2022 4.4   . Bilirubin, Total 08/28/2022 0.6   . Protein, Total 08/28/2022 7.4   . A/G Ratio 08/28/2022 1.5   . Cholesterol, Total 08/28/2022 118   . Triglyceride 08/28/2022 54   . HDL (High Density Lipopr* 96/77/7975 48.6   . LDL Calculated 08/28/2022 59   . VLDL Cholesterol 08/28/2022 11   . Cholesterol/HDL Ratio 08/28/2022 2.4   . Thyroid Stimulating Horm* 08/28/2022 1.110   . Color 08/28/2022 Colorless   . Clarity 08/28/2022 Clear   . Specific Gravity 08/28/2022 1.012   . pH, Urine 08/28/2022 6.5   . Protein, Urinalysis 08/28/2022 Negative   . Glucose, Urinalysis 08/28/2022 Negative   . Ketones, Urinalysis 08/28/2022 Negative   . Blood, Urinalysis 08/28/2022 1+ (!)   . Nitrite, Urinalysis 08/28/2022 Negative   . Leukocyte Esterase, Urin* 08/28/2022 Negative   . Bilirubin, Urinalysis 08/28/2022 Negative   . Urobilinogen, Urinalysis 08/28/2022 0.2   . WBC, UA 08/28/2022 <1   . Red Blood Cells, Urinaly* 08/28/2022 3   . Bacteria, Urinalysis 08/28/2022 0-5   . Squamous Epithelial Cell* 08/28/2022 0   . Hemoglobin A1C 08/28/2022 5.8 (H)   . Average Blood Glucose (C* 08/28/2022 120   . PSA (Prostate Specific A* 08/28/2022 0.34   . Varicella Zoster IgG - L* 08/28/2022 345     No results for input(s): IRON, TIBC, PCTSAT, FERRITIN in the last 73719 hours. Lab Results  Component Value Date   TSH 1.110 08/28/2022   ASSESSMENT AND PLAN  Mr. Vital is a 58 y.o. male seen today for initial evaluation and management Diagnoses and all orders for this visit:  BRBPR (bright red blood per rectum) -     Ambulatory Referral to Colonoscopy  Coronary artery disease involving native coronary artery of native heart without angina pectoris  Other orders -     peg-electrolyte (NULYTELY ) solution; Take 4,000 mLs by mouth once for 1 dose    1. BRBPR (bright red blood per  rectum)   2. Coronary artery disease involving native coronary artery of native heart without angina pectoris    Mr.  Kolander is a 58 yo male with history of HTN, CAD, STEMI with DES (2022) on Plavix, ischemic cardiomyopathy, diverticulitis (2017) is sent by his PCP for intermittent blood in the stool. DDX: Mild constipation with IH is most likely cause given history. Check for AVM, colon polyp, colorectal polyp/malignancy. He declines DRE    PLAN:  Schedule colonoscopy at [facility] with [facility]  -The patient is given procedure information including indications for procedure, benefits of procedure as well as possible complications, including but not limited to, bleeding, perforation, drug reaction and infection. The patient agrees with this plan & written consent will be obtained.    Advised to monitor for hard stools or mild constipation  Start Bowel regimen: Citrucel/ or psyllium fiber supplement: 1 table spoon 1-2 a day Docusate sodium /Colace: 100mg  twice a day by mouth  If needed laxative: Miralax : 1 capful 1-2 a day- if stools are too loose, can decrease to 1 capful daily  - Maintain adequate hydration  - Daily physical activity as tolerated, such as walking for 20 minutes. -  Discussed importance of daily bowel routine, such as sitting on the commode for 5 minutes at the same time each day. Treat possible hemorrhoids:   Here are some things I would recommend for hemorrhoid treatment:  1.   Ensure stools are soft -  minimize hard stools, constipation and try not to strain on the toilet  2.  Drink as much fluids as possible 3.  Increase fiber - either dietary fiber in foods or OTC fiber supplement such as metamucil, benefiber or citrucel  4.  If stools still hard, can try a stool softener like Colace (up to 4 tabs per day) or a mild laxative such as miralax  - 1 capful per day mixed in juice or water  5.  For internal hemorrhoids, you can try an OTC steroid suppository.  E.g., preparation H  suppositories (these are hydrocortisone  suppositories, you can certainly get generic store brand). You can use these for up to 1-2 weeks at a time maximum.  6.  There are higher dose prescription strength suppositories that I would be happy to call in, but the cost is surprisingly high.     Orders Placed This Encounter  Procedures  . Ambulatory Referral to Colonoscopy   Requested Prescriptions   Signed Prescriptions Disp Refills  . peg-electrolyte (NULYTELY ) solution 4000 mL 0    Sig: Take 4,000 mLs by mouth once for 1 dose     Planned follow up interval: No follow-ups on file.    I personally performed the service, non-incident to. [facility])   All questions were adequately answered to patient's satisfaction.  Patient in agreement with plan of care.    Kimberly A. Moishe, ANP Adult Nurse Practitioner [facility] [address] [place], [place] [address] [phone]

## 2023-08-06 ENCOUNTER — Encounter: Payer: Self-pay | Admitting: Gastroenterology

## 2023-08-13 ENCOUNTER — Encounter: Payer: Self-pay | Admitting: Gastroenterology

## 2023-08-19 ENCOUNTER — Encounter: Payer: Self-pay | Admitting: Gastroenterology

## 2023-08-20 ENCOUNTER — Encounter
Admission: RE | Disposition: A | Discharge status: Home / Self Care | Payer: Self-pay | Source: Home / Self Care | Attending: Gastroenterology

## 2023-08-20 ENCOUNTER — Ambulatory Visit
Admission: RE | Admit: 2023-08-20 | Discharge: 2023-08-20 | Disposition: A | Discharge status: Home / Self Care | Payer: Medicaid Other | Attending: Gastroenterology | Admitting: Gastroenterology

## 2023-08-20 ENCOUNTER — Other Ambulatory Visit: Payer: Self-pay

## 2023-08-20 ENCOUNTER — Ambulatory Visit: Admitting: General Practice

## 2023-08-20 ENCOUNTER — Encounter: Payer: Self-pay | Admitting: Gastroenterology

## 2023-08-20 DIAGNOSIS — I2510 Atherosclerotic heart disease of native coronary artery without angina pectoris: Secondary | ICD-10-CM | POA: Diagnosis not present

## 2023-08-20 DIAGNOSIS — D123 Benign neoplasm of transverse colon: Secondary | ICD-10-CM | POA: Diagnosis not present

## 2023-08-20 DIAGNOSIS — Z1211 Encounter for screening for malignant neoplasm of colon: Secondary | ICD-10-CM | POA: Insufficient documentation

## 2023-08-20 DIAGNOSIS — Z7902 Long term (current) use of antithrombotics/antiplatelets: Secondary | ICD-10-CM | POA: Insufficient documentation

## 2023-08-20 DIAGNOSIS — I509 Heart failure, unspecified: Secondary | ICD-10-CM | POA: Diagnosis not present

## 2023-08-20 DIAGNOSIS — Z955 Presence of coronary angioplasty implant and graft: Secondary | ICD-10-CM | POA: Insufficient documentation

## 2023-08-20 DIAGNOSIS — Z951 Presence of aortocoronary bypass graft: Secondary | ICD-10-CM | POA: Diagnosis not present

## 2023-08-20 DIAGNOSIS — I110 Hypertensive heart disease with heart failure: Secondary | ICD-10-CM | POA: Insufficient documentation

## 2023-08-20 DIAGNOSIS — K640 First degree hemorrhoids: Secondary | ICD-10-CM | POA: Diagnosis not present

## 2023-08-20 DIAGNOSIS — I252 Old myocardial infarction: Secondary | ICD-10-CM | POA: Insufficient documentation

## 2023-08-20 DIAGNOSIS — K5730 Diverticulosis of large intestine without perforation or abscess without bleeding: Secondary | ICD-10-CM | POA: Diagnosis not present

## 2023-08-20 HISTORY — PX: POLYPECTOMY: SHX5525

## 2023-08-20 HISTORY — DX: Acute myocardial infarction, unspecified: I21.9

## 2023-08-20 HISTORY — PX: COLONOSCOPY: SHX5424

## 2023-08-20 SURGERY — COLONOSCOPY
Anesthesia: General

## 2023-08-20 MED ORDER — PROPOFOL 500 MG/50ML IV EMUL
INTRAVENOUS | Status: DC | PRN
  Administered 2023-08-20: 125 ug/kg/min via INTRAVENOUS

## 2023-08-20 MED ORDER — PROPOFOL 10 MG/ML IV BOLUS
INTRAVENOUS | Status: DC | PRN
  Administered 2023-08-20: 30 mg via INTRAVENOUS
  Administered 2023-08-20: 50 mg via INTRAVENOUS
  Administered 2023-08-20: 20 mg via INTRAVENOUS

## 2023-08-20 MED ORDER — DEXMEDETOMIDINE HCL IN NACL 80 MCG/20ML IV SOLN
INTRAVENOUS | Status: DC | PRN
  Administered 2023-08-20: 20 ug via INTRAVENOUS

## 2023-08-20 MED ORDER — PROPOFOL 1000 MG/100ML IV EMUL
INTRAVENOUS | Status: AC
  Filled 2023-08-20: qty 100

## 2023-08-20 MED ORDER — SODIUM CHLORIDE 0.9 % IV SOLN: INTRAVENOUS | Status: DC

## 2023-08-20 MED ORDER — LIDOCAINE HCL (CARDIAC) PF 100 MG/5ML IV SOSY
PREFILLED_SYRINGE | INTRAVENOUS | Status: DC | PRN
  Administered 2023-08-20: 60 mg via INTRAVENOUS

## 2023-08-20 MED ORDER — DEXMEDETOMIDINE HCL IN NACL 80 MCG/20ML IV SOLN
INTRAVENOUS | Status: AC
  Filled 2023-08-20: qty 20

## 2023-08-20 NOTE — H&P (Signed)
 Pre-Procedure H&P   Patient ID: Robert Berry is a 58 y.o. male.  Gastroenterology Provider: Jaynie Collins, DO  Referring Provider: Hands and arms inside the vehicle PCP: Patient, No Pcp Per  Date: 08/20/2023  HPI Robert Berry is a 58 y.o. male who presents today for Colonoscopy for colorectal cancer screening .  Since the patient's initial screening.  No family history of colon cancer or colon polyps.  He was occasionally noting bright red blood per rectum when he ate pork products, but this is no longer occurred.  Plavix has been held for 5 days.  No chest pain no shortness of breath.  He has not required nitroglycerin  Hemoglobin 14.5 MCV 89 platelets 224,000 creatinine 1.1   Past Medical History:  Diagnosis Date   CHF (congestive heart failure) (HCC)    Coronary artery disease    Hypertension    Myocardial infarction [facility])     Past Surgical History:  Procedure Laterality Date   CORONARY ARTERY BYPASS GRAFT     CORONARY STENT INTERVENTION N/A 03/03/2021   Procedure: CORONARY STENT INTERVENTION;  Surgeon: Armando Reichert, MD;  Location: [facility] INVASIVE CV LAB;  Service: Cardiovascular;  Laterality: N/A;   CORONARY/GRAFT ACUTE MI REVASCULARIZATION N/A 02/28/2021   Procedure: Coronary/Graft Acute MI Revascularization;  Surgeon: Marcina Millard, MD;  Location: [facility] INVASIVE CV LAB;  Service: Cardiovascular;  Laterality: N/A;   facial construction L side Left    LEFT HEART CATH AND CORONARY ANGIOGRAPHY N/A 02/28/2021   Procedure: LEFT HEART CATH AND CORONARY ANGIOGRAPHY;  Surgeon: Marcina Millard, MD;  Location: [facility] INVASIVE CV LAB;  Service: Cardiovascular;  Laterality: N/A;    Family History No h/o GI disease or malignancy  Review of Systems  Constitutional:  Negative for activity change, appetite change, chills, diaphoresis, fatigue, fever and unexpected weight change.  HENT:  Negative for trouble swallowing and voice change.    Respiratory:  Negative for shortness of breath and wheezing.   Cardiovascular:  Negative for chest pain, palpitations and leg swelling.  Gastrointestinal:  Negative for abdominal distention, abdominal pain, anal bleeding, blood in stool, constipation, diarrhea, nausea and vomiting.  Musculoskeletal:  Negative for arthralgias and myalgias.  Skin:  Negative for color change and pallor.  Neurological:  Negative for dizziness, syncope and weakness.  Psychiatric/Behavioral:  Negative for confusion. The patient is not nervous/anxious.   All other systems reviewed and are negative.    Medications No current facility-administered medications on file prior to encounter.   Current Outpatient Medications on File Prior to Encounter  Medication Sig Dispense Refill   aspirin EC 81 MG tablet Take 81 mg by mouth daily. Swallow whole.     losartan (COZAAR) 25 MG tablet Take (1/2) tablet (12.5 mg total) by mouth once daily. 15 tablet 0   metoprolol succinate (TOPROL-XL) 25 MG 24 hr tablet Take 3 tablets (75 mg total) by mouth once daily. 90 tablet 0   atorvastatin (LIPITOR) 80 MG tablet Take 1 tablet (80 mg total) by mouth once daily. 30 tablet 0   clopidogrel (PLAVIX) 75 MG tablet Take 75 mg by mouth daily.     nitroGLYCERIN (NITROSTAT) 0.4 MG SL tablet Dissolve 1 tablet (0.4 mg total) under the tongue once every 5 (five) minutes as needed for chest pain. (Max of 3 doses in 15 minutes). 100 tablet 0    Pertinent medications related to GI and procedure were reviewed by me with the patient prior to the procedure   Current  Facility-Administered Medications:    0.9 %  sodium chloride infusion, , Intravenous, Continuous, Jaynie Collins, DO, Last Rate: 20 mL/hr at 08/20/23 1101, New Bag at 08/20/23 1101  sodium chloride 20 mL/hr at 08/20/23 1101       No Known Allergies Allergies were reviewed by me prior to the procedure  Objective   Body mass index is 23.84 kg/m. Vitals:   08/20/23 1059   BP: (!) 150/96  Pulse: 70  Resp: 16  Temp: (!) 96.9 F (36.1 C)  TempSrc: Temporal  SpO2: 99%  Weight: 71.1 kg  Height: 5\' 8"  (1.727 m)     Physical Exam Vitals and nursing note reviewed.  Constitutional:      General: He is not in acute distress.    Appearance: Normal appearance. He is not ill-appearing, toxic-appearing or diaphoretic.  HENT:     Head: Normocephalic and atraumatic.     Nose: Nose normal.     Mouth/Throat:     Mouth: Mucous membranes are moist.     Pharynx: Oropharynx is clear.  Eyes:     General: No scleral icterus.    Extraocular Movements: Extraocular movements intact.  Cardiovascular:     Rate and Rhythm: Normal rate and regular rhythm.     Heart sounds: Normal heart sounds. No murmur heard.    No friction rub. No gallop.  Pulmonary:     Effort: Pulmonary effort is normal. No respiratory distress.     Breath sounds: Normal breath sounds. No wheezing, rhonchi or rales.  Abdominal:     General: Bowel sounds are normal. There is no distension.     Palpations: Abdomen is soft.     Tenderness: There is no abdominal tenderness. There is no guarding or rebound.  Musculoskeletal:     Cervical back: Neck supple.     Right lower leg: No edema.     Left lower leg: No edema.  Skin:    General: Skin is warm and dry.     Coloration: Skin is not jaundiced or pale.  Neurological:     General: No focal deficit present.     Mental Status: He is alert and oriented to person, place, and time. Mental status is at baseline.  Psychiatric:        Mood and Affect: Mood normal.        Behavior: Behavior normal.        Thought Content: Thought content normal.        Judgment: Judgment normal.      Assessment:  Robert Berry is a 58 y.o. male  who presents today for Colonoscopy for colorectal cancer screening .  Plan:  Colonoscopy with possible intervention today  Colonoscopy with possible biopsy, control of bleeding, polypectomy, and interventions as  necessary has been discussed with the patient/patient representative. Informed consent was obtained from the patient/patient representative after explaining the indication, nature, and risks of the procedure including but not limited to death, bleeding, perforation, missed neoplasm/lesions, cardiorespiratory compromise, and reaction to medications. Opportunity for questions was given and appropriate answers were provided. Patient/patient representative has verbalized understanding is amenable to undergoing the procedure.   Jaynie Collins, DO  [facility] Gastroenterology  Portions of the record may have been created with voice recognition software. Occasional wrong-word or 'sound-a-like' substitutions may have occurred due to the inherent limitations of voice recognition software.  Read the chart carefully and recognize, using context, where substitutions may have occurred.

## 2023-08-20 NOTE — Op Note (Addendum)
 [facility] Gastroenterology Patient Name: Robert Berry Procedure Date: 08/20/2023 10:31 AM MRN: [id number] Account #: [id number] Date of Birth: [birth year] Admit Type: Outpatient Age: 58 Room: [facility] ENDO ROOM 1 Gender: Male Note Status: Supervisor Override Instrument Name: Prentice Docker [id number] Procedure:             Colonoscopy Indications:           Bright Red Blood per-rectum Providers:             Trenda Moots, DO Referring MD:          Jaynie Collins DO, DO (Referring MD), No Local                         Md, MD (Referring MD) Medicines:             Monitored Anesthesia Care Complications:         No immediate complications. Estimated blood loss:                         Minimal. Procedure:             Pre-Anesthesia Assessment:                        - Prior to the procedure, a History and Physical was                         performed, and patient medications and allergies were                         reviewed. The patient is competent. The risks and                         benefits of the procedure and the sedation options and                         risks were discussed with the patient. All questions                         were answered and informed consent was obtained.                         Patient identification and proposed procedure were                         verified by the physician, the nurse, the anesthetist                         and the technician in the endoscopy suite. Mental                         Status Examination: alert and oriented. Airway                         Examination: normal oropharyngeal airway and neck                         mobility. Respiratory Examination: clear to  auscultation. CV Examination: RRR, no murmurs, no S3                         or S4. Prophylactic Antibiotics: The patient does not                         require prophylactic antibiotics. Prior                          Anticoagulants: The patient has taken Plavix                         (clopidogrel), last dose was 5 days prior to                         procedure. ASA Grade Assessment: III - A patient with                         severe systemic disease. After reviewing the risks and                         benefits, the patient was deemed in satisfactory                         condition to undergo the procedure. The anesthesia                         plan was to use monitored anesthesia care (MAC).                         Immediately prior to administration of medications,                         the patient was re-assessed for adequacy to receive                         sedatives. The heart rate, respiratory rate, oxygen                         saturations, blood pressure, adequacy of pulmonary                         ventilation, and response to care were monitored                         throughout the procedure. The physical status of the                         patient was re-assessed after the procedure.                        After obtaining informed consent, the colonoscope was                         passed under direct vision. Throughout the procedure,                         the patient's blood pressure, pulse, and oxygen  saturations were monitored continuously. The                         Colonoscope was introduced through the anus and                         advanced to the the terminal ileum, with                         identification of the appendiceal orifice and IC                         valve. The colonoscopy was performed without                         difficulty. The patient tolerated the procedure well.                         The quality of the bowel preparation was evaluated                         using the BBPS [facility] Bowel Preparation Scale) with                         scores of: Right Colon = 3, Transverse Colon = 3 and                          Left Colon = 3 (entire mucosa seen well with no                         residual staining, small fragments of stool or opaque                         liquid). The total BBPS score equals 9. The terminal                         ileum, ileocecal valve, appendiceal orifice, and                         rectum were photographed. Findings:      The terminal ileum appeared normal. Estimated blood loss: none.      Retroflexion in the right colon was performed.      A 2 to 3 mm polyp was found in the transverse colon. The polyp was       sessile. The polyp was removed with a jumbo cold forceps. Resection and       retrieval were complete. Estimated blood loss was minimal.      Multiple small-mouthed diverticula were found in the sigmoid colon.       Estimated blood loss: none.      Anal papilla(e) were hypertrophied. Estimated blood loss: none.      Non-bleeding internal hemorrhoids were found during retroflexion. The       hemorrhoids were Grade I (internal hemorrhoids that do not prolapse).       Estimated blood loss: none.      The exam was otherwise without abnormality on direct and retroflexion       views. Impression:            -  The examined portion of the ileum was normal.                        - One 2 to 3 mm polyp in the transverse colon, removed                         with a jumbo cold forceps. Resected and retrieved.                        - Diverticulosis in the sigmoid colon.                        - Anal papilla(e) were hypertrophied.                        - Non-bleeding internal hemorrhoids.                        - The examination was otherwise normal on direct and                         retroflexion views. Recommendation:        - Patient has a contact number available for                         emergencies. The signs and symptoms of potential                         delayed complications were discussed with the patient.                         Return to normal activities  tomorrow. Written                         discharge instructions were provided to the patient.                        - Discharge patient to home.                        - Resume previous diet.                        - Continue present medications.                        - Resume Plavix (clopidogrel) at prior dose tomorrow.                         Refer to managing physician for further adjustment of                         therapy.                        - Await pathology results.                        - Repeat colonoscopy for surveillance based on  pathology results.                        - Return to referring physician as previously                         scheduled.                        - The findings and recommendations were discussed with                         the patient.                        - The findings and recommendations were discussed with                         the patient's family. Procedure Code(s):     --- Professional ---                        [phone], Colonoscopy, flexible; with biopsy, single or                         multiple Diagnosis Code(s):     --- Professional ---                        Z12.11, Encounter for screening for malignant neoplasm                         of colon                        K64.0, First degree hemorrhoids                        D12.3, Benign neoplasm of transverse colon (hepatic                         flexure or splenic flexure)                        K62.89, Other specified diseases of anus and rectum                        K57.30, Diverticulosis of large intestine without                         perforation or abscess without bleeding CPT copyright 2022 American Medical Association. All rights reserved. The codes documented in this report are preliminary and upon coder review may  be revised to meet current compliance requirements. Attending Participation:      I personally performed the entire  procedure. Elfredia Nevins, DO Jaynie Collins DO, DO 08/20/2023 11:42:34 AM This report has been signed electronically. Number of Addenda: 0 Note Initiated On: 08/20/2023 10:31 AM Scope Withdrawal Time: 0 hours 11 minutes 48 seconds  Total Procedure Duration: 0 hours 15 minutes 23 seconds  Estimated Blood Loss:  Estimated blood loss was minimal.      [facility]

## 2023-08-20 NOTE — Interval H&P Note (Signed)
 History and Physical Interval Note: Preprocedure H&P from 08/20/23  was reviewed and there was no interval change after seeing and examining the patient.  Written consent was obtained from the patient after discussion of risks, benefits, and alternatives. Patient has consented to proceed with Colonoscopy with possible intervention   08/20/2023 11:13 AM  Robert Berry  has presented today for surgery, with the diagnosis of Bright red blood per rectum.  The various methods of treatment have been discussed with the patient and family. After consideration of risks, benefits and other options for treatment, the patient has consented to  Procedure(s): COLONOSCOPY (N/A) as a surgical intervention.  The patient's history has been reviewed, patient examined, no change in status, stable for surgery.  I have reviewed the patient's chart and labs.  Questions were answered to the patient's satisfaction.     Jaynie Collins

## 2023-08-20 NOTE — Anesthesia Postprocedure Evaluation (Signed)
 Anesthesia Post Note  Patient: Robert Berry  Procedure(s) Performed: COLONOSCOPY POLYPECTOMY  Patient location during evaluation: Endoscopy Anesthesia Type: General Level of consciousness: awake and alert Pain management: pain level controlled Vital Signs Assessment: post-procedure vital signs reviewed and stable Respiratory status: spontaneous breathing, nonlabored ventilation, respiratory function stable and patient connected to nasal cannula oxygen Cardiovascular status: blood pressure returned to baseline and stable Postop Assessment: no apparent nausea or vomiting Anesthetic complications: no  No notable events documented.   Last Vitals:  Vitals:   08/20/23 1149 08/20/23 1157  BP: 113/73 125/76  Pulse: 67 77  Resp: 17 15  Temp:    SpO2: 100% 98%    Last Pain:  Vitals:   08/20/23 1157  TempSrc:   PainSc: 0-No pain                 Stephanie Coup

## 2023-08-20 NOTE — Anesthesia Preprocedure Evaluation (Signed)
 Anesthesia Evaluation  Patient identified by MRN, date of birth, ID band Patient awake    Reviewed: Allergy & Precautions, NPO status , Patient's Chart, lab work & pertinent test results  Airway Mallampati: II  TM Distance: >3 FB Neck ROM: full    Dental  (+) Chipped, Dental Advidsory Given   Pulmonary neg pulmonary ROS   Pulmonary exam normal        Cardiovascular hypertension, On Medications + CAD, + Past MI, + CABG and +CHF  Normal cardiovascular exam     Neuro/Psych negative neurological ROS  negative psych ROS   GI/Hepatic negative GI ROS, Neg liver ROS,,,  Endo/Other  negative endocrine ROS    Renal/GU negative Renal ROS  negative genitourinary   Musculoskeletal   Abdominal   Peds  Hematology negative hematology ROS (+)   Anesthesia Other Findings Past Medical History: No date: CHF (congestive heart failure) (HCC) No date: Coronary artery disease No date: Hypertension No date: Myocardial infarction [facility])  Past Surgical History: No date: CORONARY ARTERY BYPASS GRAFT 03/03/2021: CORONARY STENT INTERVENTION; N/A     Comment:  Procedure: CORONARY STENT INTERVENTION;  Surgeon: Armando Reichert, MD;  Location: [facility] INVASIVE CV LAB;                Service: Cardiovascular;  Laterality: N/A; 02/28/2021: CORONARY/GRAFT ACUTE MI REVASCULARIZATION; N/A     Comment:  Procedure: Coronary/Graft Acute MI Revascularization;                Surgeon: Marcina Millard, MD;  Location: [facility]               INVASIVE CV LAB;  Service: Cardiovascular;  Laterality:               N/A; No date: facial construction L side; Left 02/28/2021: LEFT HEART CATH AND CORONARY ANGIOGRAPHY; N/A     Comment:  Procedure: LEFT HEART CATH AND CORONARY ANGIOGRAPHY;                Surgeon: Marcina Millard, MD;  Location: [facility]               INVASIVE CV LAB;  Service: Cardiovascular;  Laterality:               N/A;  BMI     Body Mass Index: 23.84 kg/m      Reproductive/Obstetrics negative OB ROS                             Anesthesia Physical Anesthesia Plan  ASA: 3  Anesthesia Plan: General   Post-op Pain Management: Minimal or no pain anticipated   Induction: Intravenous  PONV Risk Score and Plan: 3 and Propofol infusion, TIVA and Ondansetron  Airway Management Planned: Nasal Cannula  Additional Equipment: None  Intra-op Plan:   Post-operative Plan:   Informed Consent: I have reviewed the patients History and Physical, chart, labs and discussed the procedure including the risks, benefits and alternatives for the proposed anesthesia with the patient or authorized representative who has indicated his/her understanding and acceptance.     Dental advisory given  Plan Discussed with: CRNA and Surgeon  Anesthesia Plan Comments: (Discussed risks of anesthesia with patient, including possibility of difficulty with spontaneous ventilation under anesthesia necessitating airway intervention, PONV, and rare risks such as cardiac or respiratory or neurological events, and allergic reactions. Discussed the role  of CRNA in patient's perioperative care. Patient understands.)       Anesthesia Quick Evaluation

## 2023-08-20 NOTE — Transfer of Care (Signed)
 Immediate Anesthesia Transfer of Care Note  Patient: Robert Berry  Procedure(s) Performed: COLONOSCOPY POLYPECTOMY  Patient Location: PACU  Anesthesia Type:General  Level of Consciousness: sedated  Airway & Oxygen Therapy: Patient Spontanous Breathing  Post-op Assessment: Report given to RN and Post -op Vital signs reviewed and stable  Post vital signs: Reviewed and stable  Last Vitals:  Vitals Value Taken Time  BP 107/71 08/20/23 1140  Temp    Pulse 76 08/20/23 1141  Resp 19 08/20/23 1141  SpO2 98 % 08/20/23 1141  Vitals shown include unfiled device data.  Last Pain:  Vitals:   08/20/23 1059  TempSrc: Temporal  PainSc: 0-No pain         Complications: No notable events documented.

## 2023-08-21 ENCOUNTER — Encounter: Payer: Self-pay | Admitting: Gastroenterology

## 2023-08-23 LAB — SURGICAL PATHOLOGY

## 2023-11-09 ENCOUNTER — Other Ambulatory Visit: Payer: Self-pay

## 2023-11-09 ENCOUNTER — Emergency Department

## 2023-11-09 ENCOUNTER — Emergency Department
Admission: EM | Admit: 2023-11-09 | Discharge: 2023-11-09 | Disposition: A | Discharge status: Home / Self Care | Attending: Emergency Medicine | Admitting: Emergency Medicine

## 2023-11-09 ENCOUNTER — Encounter: Payer: Self-pay | Admitting: Emergency Medicine

## 2023-11-09 DIAGNOSIS — S46211A Strain of muscle, fascia and tendon of other parts of biceps, right arm, initial encounter: Secondary | ICD-10-CM | POA: Diagnosis not present

## 2023-11-09 DIAGNOSIS — S46219A Strain of muscle, fascia and tendon of other parts of biceps, unspecified arm, initial encounter: Secondary | ICD-10-CM

## 2023-11-09 DIAGNOSIS — X501XXA Overexertion from prolonged static or awkward postures, initial encounter: Secondary | ICD-10-CM | POA: Insufficient documentation

## 2023-11-09 DIAGNOSIS — M25511 Pain in right shoulder: Secondary | ICD-10-CM | POA: Diagnosis present

## 2023-11-09 DIAGNOSIS — S4991XA Unspecified injury of right shoulder and upper arm, initial encounter: Secondary | ICD-10-CM

## 2023-11-09 MED ORDER — NAPROXEN 500 MG PO TABS: 500.0000 mg | ORAL_TABLET | Freq: Two times a day (BID) | ORAL | 0 refills | Status: AC

## 2023-11-09 MED ORDER — KETOROLAC TROMETHAMINE 15 MG/ML IJ SOLN
15.0000 mg | Freq: Once | INTRAMUSCULAR | Status: AC
  Administered 2023-11-09: 15 mg via INTRAMUSCULAR
  Filled 2023-11-09: qty 1

## 2023-11-09 NOTE — Discharge Instructions (Signed)
 You may wear your sling fracture support and take the medication as prescribed to help with your pain.  Please follow-up with orthopedics in 1 week.  Please return for any new, worsening, or change in symptoms or other concerns.  It was a pleasure caring for you today.

## 2023-11-09 NOTE — ED Triage Notes (Signed)
 Patient to ED via POV for right arm injury. Pt states he was carrying a 300lb pressure washer when he slipped. States pain in right shoulder and radiates down arm.

## 2023-11-09 NOTE — ED Provider Notes (Signed)
 [facility] Provider Note    Event Date/Time   First MD Initiated Contact with Patient 11/09/23 1750     (approximate)   History   Arm Injury   HPI  Robert Berry is a 58 y.o. male who presents today for evaluation of right shoulder pain.  Patient reports that he was carrying a heavy pressure washer that he estimates to be approximately 300 pounds when he slipped and felt something pull in his shoulder.  This occurred yesterday.  This morning he was taking a protein shake and felt something pop in his upper arm and he noticed a deformity immediately.  He reports that the pain has persisted.  No weakness.  Patient Active Problem List   Diagnosis Date Noted   STEMI (ST elevation myocardial infarction) (HCC) 02/28/2021          Physical Exam   Triage Vital Signs: ED Triage Vitals [11/09/23 1608]  Encounter Vitals Group     BP (!) 167/104     Systolic BP Percentile      Diastolic BP Percentile      Pulse Rate (!) 115     Resp 18     Temp 98.3 F (36.8 C)     Temp Source Oral     SpO2 96 %     Weight 155 lb (70.3 kg)     Height 5\' 5"  (1.651 m)     Head Circumference      Peak Flow      Pain Score 9     Pain Loc      Pain Education      Exclude from Growth Chart     Most recent vital signs: Vitals:   11/09/23 1608  BP: (!) 167/104  Pulse: (!) 115  Resp: 18  Temp: 98.3 F (36.8 C)  SpO2: 96%    Physical Exam Vitals and nursing note reviewed.  Constitutional:      General: Awake and alert. No acute distress.    Appearance: Normal appearance. The patient is normal weight.  HENT:     Head: Normocephalic and atraumatic.     Mouth: Mucous membranes are moist.  Eyes:     General: PERRL. Normal EOMs        Right eye: No discharge.        Left eye: No discharge.     Conjunctiva/sclera: Conjunctivae normal.  Cardiovascular:     Rate and Rhythm: Normal rate and regular rhythm.     Pulses: Normal pulses.  Pulmonary:      Effort: Pulmonary effort is normal. No respiratory distress.     Breath sounds: Normal breath sounds.  Abdominal:     Abdomen is soft. There is no abdominal tenderness. No rebound or guarding. No distention. Musculoskeletal:        General: No swelling. Normal range of motion.     Cervical back: Normal range of motion and neck supple.  Right shoulder: Faint ecchymosis noted over the mid bicep area.  When attempting a bicep curl patient has a Popeye deformity in the mid bicep area with tenderness proximally.  No clavicular or AC joint tenderness Able to actively and passively forward flex and abduct at shoulder to 90 degrees only secondary to pain, negative drop arm test Pain with Obriens, SLAP, empty can, and patient would not attempt lift off tests Normal internal and external rotation against resistance Normal ROM at elbow and wrist Normal resisted pronation and supination 2+ radial pulse  Normal grip strength Normal intrinsic hand muscle function Skin:    General: Skin is warm and dry.     Capillary Refill: Capillary refill takes less than 2 seconds.     Findings: No rash.  Neurological:     Mental Status: The patient is awake and alert.      ED Results / Procedures / Treatments   Labs (all labs ordered are listed, but only abnormal results are displayed) Labs Reviewed - No data to display   EKG     RADIOLOGY I independently reviewed and interpreted imaging and agree with radiologists findings.     PROCEDURES:  Critical Care performed:   Procedures   MEDICATIONS ORDERED IN ED: Medications  ketorolac  (TORADOL ) 15 MG/ML injection 15 mg (15 mg Intramuscular Given 11/09/23 1801)     IMPRESSION / MDM / ASSESSMENT AND PLAN / ED COURSE  I reviewed the triage vital signs and the nursing notes.   Differential diagnosis includes, but is not limited to, biceps tendon rupture, rotator cuff injury, less likely fracture or dislocation.  Patient is awake and alert,  nontoxic in appearance.  He does have a deformity over his bicep with faint ecchymosis, consistent with partial or complete biceps tendon rupture.  Mechanism injury yesterday suspicious for a rotator cuff injury as well.  X-ray obtained in triage reveals degenerative changes of the acromioclavicular joint. I discussed with orthopedics on-call, Dr. Daun Epstein, who recommends sling and outpatient follow-up in 1 week.  Patient was given the appropriate follow-up information.  We discussed return precautions in the meantime.  He was started on naproxen for pain control.  Patient was discharged in stable condition.   Patient's presentation is most consistent with acute complicated illness / injury requiring diagnostic workup.      FINAL CLINICAL IMPRESSION(S) / ED DIAGNOSES   Final diagnoses:  Biceps tendon tear  Injury of right upper extremity, initial encounter     Rx / DC Orders   ED Discharge Orders          Ordered    naproxen (NAPROSYN) 500 MG tablet  2 times daily with meals        11/09/23 1803             Note:  This document was prepared using Dragon voice recognition software and may include unintentional dictation errors.   Kaila Devries E, PA-C 11/09/23 Teena Feast, MD 11/09/23 [phone]

## 2023-12-15 ENCOUNTER — Emergency Department
Admission: EM | Admit: 2023-12-15 | Discharge: 2023-12-15 | Disposition: A | Discharge status: Home / Self Care | Attending: Emergency Medicine | Admitting: Emergency Medicine

## 2023-12-15 ENCOUNTER — Other Ambulatory Visit: Payer: Self-pay

## 2023-12-15 DIAGNOSIS — K644 Residual hemorrhoidal skin tags: Secondary | ICD-10-CM | POA: Diagnosis not present

## 2023-12-15 DIAGNOSIS — K6289 Other specified diseases of anus and rectum: Secondary | ICD-10-CM | POA: Diagnosis present

## 2023-12-15 DIAGNOSIS — I110 Hypertensive heart disease with heart failure: Secondary | ICD-10-CM | POA: Diagnosis not present

## 2023-12-15 DIAGNOSIS — I509 Heart failure, unspecified: Secondary | ICD-10-CM | POA: Diagnosis not present

## 2023-12-15 MED ORDER — OXYCODONE-ACETAMINOPHEN 5-325 MG PO TABS: 1.0000 | ORAL_TABLET | Freq: Three times a day (TID) | ORAL | 0 refills | Status: AC | PRN

## 2023-12-15 MED ORDER — POLYETHYLENE GLYCOL 3350 17 G PO PACK: 17.0000 g | PACK | Freq: Every day | ORAL | 0 refills | Status: AC

## 2023-12-15 MED ORDER — DOCUSATE SODIUM 100 MG PO CAPS: 100.0000 mg | ORAL_CAPSULE | Freq: Every day | ORAL | 0 refills | Status: AC | PRN

## 2023-12-15 MED ORDER — OXYCODONE-ACETAMINOPHEN 5-325 MG PO TABS
1.0000 | ORAL_TABLET | Freq: Once | ORAL | Status: AC
  Administered 2023-12-15: 1 via ORAL
  Filled 2023-12-15: qty 1

## 2023-12-15 MED ORDER — HYDROCORTISONE ACETATE 25 MG RE SUPP: 25.0000 mg | Freq: Two times a day (BID) | RECTAL | 0 refills | Status: AC

## 2023-12-15 NOTE — ED Provider Notes (Signed)
 [facility] Provider Note    Event Date/Time   First MD Initiated Contact with Patient 12/15/23 1122     (approximate)   History   Hemorrhoids   HPI  Robert Berry is a 58 y.o. male  with a past medical history of STEMI, CHF, hypertension presents to the emergency department with concern for hemorrhoid and rectal pain x 3 days.  Endorses some blood on toilet paper when wiping.  Denies diarrhea, vomiting, abdominal pain, nausea, dysuria, hematuria.  Had not tried any remedies at home, including no pain medicine.  Is having trouble sleeping and getting comfortable due to his pain.  Had recent colonoscopy in March 2025 which showed 1 colon polyp.  He does take Plavix and aspirin .     Physical Exam   Triage Vital Signs: ED Triage Vitals  Encounter Vitals Group     BP 12/15/23 1113 (!) 130/101     Girls Systolic BP Percentile --      Girls Diastolic BP Percentile --      Boys Systolic BP Percentile --      Boys Diastolic BP Percentile --      Pulse Rate 12/15/23 1113 62     Resp 12/15/23 1113 20     Temp 12/15/23 1113 98.1 F (36.7 C)     Temp Source 12/15/23 1113 Oral     SpO2 12/15/23 1113 99 %     Weight 12/15/23 1113 155 lb (70.3 kg)     Height 12/15/23 1113 5' 8 (1.727 m)     Head Circumference --      Peak Flow --      Pain Score 12/15/23 1112 7     Pain Loc --      Pain Education --      Exclude from Growth Chart --     Most recent vital signs: Vitals:   12/15/23 1113 12/15/23 1256  BP: (!) 130/101 (!) 131/95  Pulse: 62 61  Resp: 20 18  Temp: 98.1 F (36.7 C) 98 F (36.7 C)  SpO2: 99% 99%    General: Awake, in no acute distress. Appears stated age. Head: Normocephalic, atraumatic. Neck: Supple. CV: Regular rate, 61 bpm. Peripheral pulses 2+ and symmetric. No edema. Respiratory: No respiratory distress. Normal respiratory effort. GI: Soft, non-distended, non-tender. No rebound or guarding.  Skin:Warm, dry, intact. No  rashes, lesions, or ecchymosis. No cyanosis or pallor. Neurological: A&Ox4 to person, place, time, and situation.  External hemorrhoid visualized in the 8 o'clock position, no evidence of thrombosis.  Endorses pain with palpation.  No fluctuance.  No puslike drainage.  DRE deferred due to pain.  ED Results / Procedures / Treatments   Labs (all labs ordered are listed, but only abnormal results are displayed) Labs Reviewed - No data to display   EKG     RADIOLOGY    PROCEDURES:  Critical Care performed: No   Procedures   MEDICATIONS ORDERED IN ED: Medications  oxyCODONE -acetaminophen  (PERCOCET/ROXICET) 5-325 MG per tablet 1 tablet (1 tablet Oral Given 12/15/23 1239)     IMPRESSION / MDM / ASSESSMENT AND PLAN / ED COURSE  I reviewed the triage vital signs and the nursing notes.                              Differential diagnosis includes, but is not limited to, external hemorrhoid, internal hemorrhoid, anal fissure, perirectal abscess  Patient's presentation is  most consistent with acute, uncomplicated illness.  Patient is a 58 year old male presenting today with pain in his rectal area.  Physical exam shows external hemorrhoid in the 8 o'clock position, no evidence of thrombosis. We discussed bleeding concern regarding taking Plavix and aspirin  with incising the hemorrhoid; patient is agreeable to trying the least invasive outpatient remedies first.  We discussed using stool softeners at home, hydrocortisone  suppository to help with pain and he was given 3 days worth of Percocet for his pain.  Encouraged warm sitz bath's, Preparation H cream and increase fiber and hydration.   Follow-up information with general surgery provided if his pain is not improving.  Patient was given the opportunity to ask questions; all questions were answered. Emergency department return precautions were discussed with the patient.  Patient is in agreement to the treatment plan.  Patient is stable  for discharge.     FINAL CLINICAL IMPRESSION(S) / ED DIAGNOSES   Final diagnoses:  External hemorrhoid     Rx / DC Orders   ED Discharge Orders          Ordered    oxyCODONE -acetaminophen  (PERCOCET) 5-325 MG tablet  Every 8 hours PRN        12/15/23 1241    hydrocortisone  (ANUSOL -HC) 25 MG suppository  2 times daily        12/15/23 1241    polyethylene glycol (MIRALAX ) 17 g packet  Daily        12/15/23 1241    docusate sodium  (COLACE) 100 MG capsule  Daily PRN        12/15/23 1241             Note:  This document was prepared using Dragon voice recognition software and may include unintentional dictation errors.     Sheron Salm, PA-C 12/15/23 1534    Claudene Rover, MD 12/15/23 [phone]

## 2023-12-15 NOTE — Discharge Instructions (Addendum)
 You were seen in the emergency department for an external hemorrhoid.  Please pick up and take the medications as prescribed.  Please increase fluid intake and fiber in your diet.  Please do warm water sitz bath's at home multiple times per day to help with pain.  Please pick up Preparation H hemorrhoid cream from any drugstore over-the-counter and use as needed for pain. Please follow-up with the general surgeon listed in this paperwork if your symptoms worsen or in the emergency department for any new or worsening symptoms.

## 2023-12-15 NOTE — ED Triage Notes (Signed)
 Patient states he has had a hemorrhoid for about 3 days.
